# Patient Record
Sex: Male | Born: 1965 | Race: White | Hispanic: No | Marital: Married | Smoking: Former smoker
Health system: Southern US, Community
[De-identification: ages and names within clinical notes are randomized; demographics above are authoritative.]

## PROBLEM LIST (undated history)

## (undated) DIAGNOSIS — M159 Polyosteoarthritis, unspecified: Secondary | ICD-10-CM

## (undated) DIAGNOSIS — F419 Anxiety disorder, unspecified: Secondary | ICD-10-CM

## (undated) DIAGNOSIS — N5089 Other specified disorders of the male genital organs: Secondary | ICD-10-CM

## (undated) DIAGNOSIS — C6290 Malignant neoplasm of unspecified testis, unspecified whether descended or undescended: Secondary | ICD-10-CM

## (undated) HISTORY — DX: Other specified disorders of the male genital organs: N50.89

## (undated) HISTORY — PX: NO PAST SURGERIES: SHX2092

## (undated) HISTORY — DX: Anxiety disorder, unspecified: F41.9

## (undated) HISTORY — DX: Polyosteoarthritis, unspecified: M15.9

## (undated) HISTORY — DX: Malignant neoplasm of unspecified testis, unspecified whether descended or undescended: C62.90

---

## 2005-01-25 ENCOUNTER — Ambulatory Visit: Payer: Self-pay | Admitting: Internal Medicine

## 2005-02-05 ENCOUNTER — Ambulatory Visit: Payer: Self-pay | Admitting: Internal Medicine

## 2005-03-03 ENCOUNTER — Ambulatory Visit: Payer: Self-pay | Admitting: Internal Medicine

## 2005-03-10 ENCOUNTER — Encounter: Admission: RE | Admit: 2005-03-10 | Discharge: 2005-03-10 | Payer: Self-pay | Admitting: Internal Medicine

## 2005-03-12 ENCOUNTER — Encounter: Payer: Self-pay | Admitting: Internal Medicine

## 2005-06-24 ENCOUNTER — Ambulatory Visit: Payer: Self-pay | Admitting: Internal Medicine

## 2005-11-01 ENCOUNTER — Ambulatory Visit: Payer: Self-pay | Admitting: Internal Medicine

## 2006-01-04 ENCOUNTER — Ambulatory Visit: Payer: Self-pay | Admitting: Internal Medicine

## 2006-02-11 ENCOUNTER — Emergency Department: Payer: Self-pay | Admitting: Emergency Medicine

## 2006-04-21 ENCOUNTER — Ambulatory Visit: Payer: Self-pay | Admitting: Internal Medicine

## 2006-05-10 ENCOUNTER — Ambulatory Visit: Payer: Self-pay | Admitting: Internal Medicine

## 2006-07-22 ENCOUNTER — Ambulatory Visit: Payer: Self-pay | Admitting: Family Medicine

## 2006-07-27 ENCOUNTER — Ambulatory Visit: Payer: Self-pay | Admitting: Internal Medicine

## 2006-08-01 ENCOUNTER — Ambulatory Visit: Payer: Self-pay | Admitting: Internal Medicine

## 2006-11-25 ENCOUNTER — Ambulatory Visit: Payer: Self-pay | Admitting: Internal Medicine

## 2007-08-08 ENCOUNTER — Ambulatory Visit: Payer: Self-pay | Admitting: Internal Medicine

## 2007-08-08 DIAGNOSIS — J019 Acute sinusitis, unspecified: Secondary | ICD-10-CM

## 2009-01-30 ENCOUNTER — Ambulatory Visit: Payer: Self-pay | Admitting: Internal Medicine

## 2009-01-30 DIAGNOSIS — F4389 Other reactions to severe stress: Secondary | ICD-10-CM | POA: Insufficient documentation

## 2009-01-30 DIAGNOSIS — F438 Other reactions to severe stress: Secondary | ICD-10-CM

## 2009-02-21 ENCOUNTER — Telehealth (INDEPENDENT_AMBULATORY_CARE_PROVIDER_SITE_OTHER): Payer: Self-pay | Admitting: *Deleted

## 2009-02-21 ENCOUNTER — Ambulatory Visit: Payer: Self-pay | Admitting: Internal Medicine

## 2009-02-21 DIAGNOSIS — F411 Generalized anxiety disorder: Secondary | ICD-10-CM | POA: Insufficient documentation

## 2009-03-19 ENCOUNTER — Ambulatory Visit: Payer: Self-pay | Admitting: Internal Medicine

## 2009-03-19 DIAGNOSIS — J309 Allergic rhinitis, unspecified: Secondary | ICD-10-CM | POA: Insufficient documentation

## 2009-07-02 ENCOUNTER — Ambulatory Visit: Payer: Self-pay | Admitting: Family Medicine

## 2009-07-02 DIAGNOSIS — M159 Polyosteoarthritis, unspecified: Secondary | ICD-10-CM

## 2009-12-29 ENCOUNTER — Telehealth: Payer: Self-pay | Admitting: Internal Medicine

## 2011-01-19 NOTE — Progress Notes (Signed)
Summary: sinus infection   Phone Note Call from Patient Call back at Home Phone [phone]   Caller: Patient Call For: Cindee Salt MD Summary of Call: Patient called and said that he feels like he has sinus infection. Wants to know what he can take over the counter because he does not have the money to come in and be seen.  Initial call taken by: Melody Comas,  December 29, 2009 10:28 AM  Follow-up for Phone Call        okay to try tylenol or ibuprofen for general symptoms Honey helps cough Vick's vapor rub may help open up his sinuses Some people feel a decongestant helps them---okay to try this but only continue if sig improvement--since it may cause side effects Follow-up by: Cindee Salt MD,  December 29, 2009 11:29 AM  Additional Follow-up for Phone Call Additional follow up Details #1::        pt states he is having a little bit of blood coming out of his nose when he blows it, he thought he was getting a sinus infection.  I advised results and also told pt to add some moisture in his air at home, pt states he uses a fireplace at home. Please advise DeShannon Katrinka Blazing CMA [facility])  December 29, 2009 1:07 PM   Yes, the dry air is likely the reason for the nose bleeds. A humidifier can help, esp with the dry air from a fireplace Offer appt if he is not improving or worsens  Additional Follow-up by: Cindee Salt MD,  December 29, 2009 1:21 PM    Additional Follow-up for Phone Call Additional follow up Details #2::    Spoke with patient and advised results.  will call if appt needed Follow-up by: Mervin Hack CMA [facility]),  December 29, 2009 2:44 PM

## 2011-09-20 DIAGNOSIS — N5089 Other specified disorders of the male genital organs: Secondary | ICD-10-CM

## 2011-09-20 HISTORY — DX: Other specified disorders of the male genital organs: N50.89

## 2011-09-30 ENCOUNTER — Encounter: Payer: Self-pay | Admitting: Internal Medicine

## 2011-10-01 ENCOUNTER — Telehealth: Payer: Self-pay | Admitting: *Deleted

## 2011-10-01 ENCOUNTER — Ambulatory Visit (INDEPENDENT_AMBULATORY_CARE_PROVIDER_SITE_OTHER): Payer: Self-pay | Admitting: Family Medicine

## 2011-10-01 ENCOUNTER — Ambulatory Visit: Payer: Self-pay | Admitting: Family Medicine

## 2011-10-01 ENCOUNTER — Encounter: Payer: Self-pay | Admitting: Family Medicine

## 2011-10-01 VITALS — BP 130/80 | HR 88 | Temp 98.6°F | Wt 176.2 lb

## 2011-10-01 DIAGNOSIS — N50811 Right testicular pain: Secondary | ICD-10-CM

## 2011-10-01 DIAGNOSIS — N509 Disorder of male genital organs, unspecified: Secondary | ICD-10-CM

## 2011-10-01 DIAGNOSIS — N50819 Testicular pain, unspecified: Secondary | ICD-10-CM

## 2011-10-01 LAB — POCT URINALYSIS DIPSTICK
Bilirubin, UA: NEGATIVE
Blood, UA: NEGATIVE
Ketones, UA: NEGATIVE
Leukocytes, UA: NEGATIVE
Protein, UA: NEGATIVE
Spec Grav, UA: 1.025
Urobilinogen, UA: 0.2
pH, UA: 6

## 2011-10-01 MED ORDER — NAPROXEN 500 MG PO TABS: ORAL_TABLET | ORAL | Status: DC

## 2011-10-01 NOTE — Telephone Encounter (Signed)
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  Patient is waiting there to hear from [place].

## 2011-10-01 NOTE — Progress Notes (Signed)
  Subjective:    Patient ID: Jeff Wade, male    DOB: 1966/07/18, 45 y.o.   MRN: [id number]  HPI CC: swollen testicle  3-4 wk h/o swelling right testicle.  1 wk ago started getting even larger.  Yesterday much larger.  Started feeling pressure "squeeze" sensation.  Today feeling pressure left groin.  2d ago describes paresthesias left lateral thigh.  No fevers/chills, dysuria, urethral discharge, rashes.  Not sexually active recently, married.  For last several years if doesn't ejaculate after 3d feels pressure, tightness.  Not large amt straining.  Review of Systems Per HPI    Objective:   Physical Exam  Nursing note and vitals reviewed. Constitutional: He appears well-developed and well-nourished.  Abdominal: Soft. Bowel sounds are normal. He exhibits no distension. There is no tenderness. There is no rebound and no guarding. Hernia confirmed negative in the right inguinal area and confirmed negative in the left inguinal area.  Genitourinary: Penis normal. Right testis shows swelling and tenderness. Left testis shows no mass, no swelling and no tenderness. Left testis is descended. Cremasteric reflex is not absent on the left side. Circumcised.       Right testicle swollen and uniformly hardened. Especially tender posterior testicle at epididymis  Lymphadenopathy:       Right: No inguinal adenopathy present.       Left: No inguinal adenopathy present.  Skin: Skin is warm and dry. No rash noted.  Psychiatric: He has a normal mood and affect.          Assessment & Plan:

## 2011-10-01 NOTE — Patient Instructions (Signed)
Pass by Marion's office for referral to get ultrasound done today. Use anti inflammatory - naprosyn twice daily with food for 1 wk then as needed. Elevate scrotum when prolonged seated (towel under scrotum). We will call you at [phone]

## 2011-10-01 NOTE — Assessment & Plan Note (Addendum)
?  Infectious epididymo-orchitis - will need ultrasound today to r/o other (mass, torsion). Discussed NSAID use and elevation of scrotum in case infectious.  Possibly congestive epididymitis? [place] today - if looking like orchitis, treat with levo 500mg  daily for 10 days. UA today - normal.  Sent culture regardless. not high risk for STDs.

## 2011-10-01 NOTE — Telephone Encounter (Signed)
Called and discussed with pt around 11am. Have call in to urology, will call pt with f/u plan. Spoke with Dr. Laverle Patter - rec copy of ultrasound on CD and set up with urology for next week. Called pt and discussed. Will route to [facility] to get urology appt for early next week.

## 2011-10-03 LAB — URINE CULTURE: Colony Count: NO GROWTH

## 2011-10-04 NOTE — Telephone Encounter (Signed)
Urology appt made with Dr Mena Goes on 11/07/2011 records were faxed. MK

## 2011-10-15 ENCOUNTER — Encounter: Payer: Self-pay | Admitting: Family Medicine

## 2011-10-21 DIAGNOSIS — C6290 Malignant neoplasm of unspecified testis, unspecified whether descended or undescended: Secondary | ICD-10-CM

## 2011-10-21 HISTORY — DX: Malignant neoplasm of unspecified testis, unspecified whether descended or undescended: C62.90

## 2011-11-08 ENCOUNTER — Encounter (HOSPITAL_COMMUNITY): Payer: Self-pay | Admitting: Pharmacy Technician

## 2011-11-09 ENCOUNTER — Ambulatory Visit (HOSPITAL_COMMUNITY)
Admission: RE | Admit: 2011-11-09 | Discharge: 2011-11-09 | Disposition: A | Discharge status: Home / Self Care | Payer: Self-pay | Source: Ambulatory Visit | Attending: Urology | Admitting: Urology

## 2011-11-09 ENCOUNTER — Encounter (HOSPITAL_COMMUNITY)
Admission: RE | Admit: 2011-11-09 | Discharge: 2011-11-09 | Disposition: A | Discharge status: Home / Self Care | Payer: Self-pay | Source: Ambulatory Visit | Attending: Urology | Admitting: Urology

## 2011-11-09 ENCOUNTER — Encounter (HOSPITAL_COMMUNITY): Payer: Self-pay

## 2011-11-09 DIAGNOSIS — M159 Polyosteoarthritis, unspecified: Secondary | ICD-10-CM

## 2011-11-09 DIAGNOSIS — D4959 Neoplasm of unspecified behavior of other genitourinary organ: Secondary | ICD-10-CM | POA: Insufficient documentation

## 2011-11-09 DIAGNOSIS — Z01812 Encounter for preprocedural laboratory examination: Secondary | ICD-10-CM | POA: Insufficient documentation

## 2011-11-09 HISTORY — DX: Polyosteoarthritis, unspecified: M15.9

## 2011-11-09 LAB — SURGICAL PCR SCREEN: Staphylococcus aureus: NEGATIVE

## 2011-11-09 NOTE — Pre-Procedure Instructions (Signed)
11-09-11 Cxr Done. No labs required per anesthesia or EKG.

## 2011-11-09 NOTE — Patient Instructions (Addendum)
20 Jeff Wade  11/09/2011   Your procedure is scheduled on: 11-16-11  Report to [facility] at 10:30 AM.  Call this number if you have problems the morning of surgery: [phone]   Remember:   Do not eat food:After Midnight.  Do not drink clear liquids: 6 Hours before arrival.Clear Liquids until 06:00am only, then nothing.  Take these medicines the morning of surgery with A SIP OF WATER: Antihistamine med   Do not wear jewelry, make-up or nail polish.  Do not wear lotions, powders, or perfumes. You may wear deodorant.  Do not shave 48 hours prior to surgery.  Do not bring valuables to the hospital.  Contacts, dentures or bridgework may not be worn into surgery.  Leave suitcase in the car. After surgery it may be brought to your room.  For patients admitted to the hospital, checkout time is 11:00 AM the day of discharge.   Patients discharged the day of surgery will not be allowed to drive home.  Name and phone number of your driver:Jeff Wade, spouse [phone]  Special Instructions: CHG Shower Use Special Wash: 1/2 bottle night before surgery and 1/2 bottle morning of surgery.   Please read over the following fact sheets that you were given: MRSA Information

## 2011-11-15 NOTE — H&P (Addendum)
History of Present Illness        Patient first noticed Sep 2012 right testicle seemed larger than left. About a week later the right testicle seemed to continue to enlarge quickly and was hard. Felt pressure in right testicle. He has not noticed anything that has made the testicle better or worse. He has no significant pain. No trauma recently. No GU surgery or any other surgery. No kidney stones. He has had no dysuria, hematuria or other lower urinary tract symptoms. No fever or wt loss. He has chronic back pain. 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 UA Oct 01, 2011 clear and cx negative. Scrotal US Oct 01, 2011 - large hypoechoic area in right testicle worrisome for mass/malignancy. Left testicle normal (I reviewed all images).   Past Medical History Problems  1. History of  No Medical Problems  Surgical History Problems  1. History of  No Surgical Problems  Current Meds 1. Ibuprofen TABS; Therapy: (Recorded:18Oct2012) to 2. ZyrTEC Allergy 10 MG Oral Tablet; Therapy: (Recorded:18Oct2012) to  Allergies Medication  1. No Known Drug Allergies  Family History Problems  1. Family history of  Family Health Status Children ___ Living Sons 1 2. Family history of  Family Health Status Of Father - Alive 3. Family history of  Family Health Status Of Mother - Alive 4. Paternal history of  Nephrolithiasis  Social History Problems    Caffeine Use 3   Marital History - Currently Married   Occupation: Surveyor, minerals   Tobacco Use 305.1 smokes 1 ppd for 22 years Denied    History of  Alcohol Used  ROS (cont) - pt has had tiredness and fatigue for past year. Denies wt loss or night sweats/fevers.  Review of Systems Genitourinary,  skin, eye, otolaryngeal, hematologic/lymphatic, cardiovascular, pulmonary, endocrine, musculoskeletal, gastrointestinal, neurological and psychiatric system(s) were reviewed and pertinent findings if present are noted.  Musculoskeletal: back pain and  joint pain.     Physical Exam Constitutional: Well nourished and well developed . No acute distress.  ENT:. The ears and nose are normal in appearance.  Neck: The appearance of the neck is normal and no neck mass is present.  Pulmonary: No respiratory distress and normal respiratory rhythm and effort.  Cardiovascular: Heart rate and rhythm are normal . No peripheral edema.  Abdomen: The abdomen is soft and nontender. No masses are palpated. No CVA tenderness. No hernias are palpable. No hepatosplenomegaly noted.  Rectal: Rectal exam demonstrates normal sphincter tone, no tenderness and no masses. The prostate has no nodularity and is not tender. The left seminal vesicle is nonpalpable. The right seminal vesicle is nonpalpable. The perineum is normal on inspection.  Genitourinary: Examination of the penis demonstrates no discharge, no masses, no lesions and a normal meatus. The scrotum is without lesions. The right epididymis is palpably normal and non-tender. The left epididymis is palpably normal and non-tender. The right testis is found to have a 4 cm right testicular mass, but non-tender. The left testis is non-tender and without masses.  The testicles are easy to examine. The right testicle is enlarged compared to the left and mainly replaced by a large hard mass. Again, the left testicle and epididymis is normal on exam without mass.  Lymphatics: The femoral and inguinal nodes are not enlarged or tender.  Skin: Normal skin turgor, no visible rash and no visible skin lesions.  Neuro/Psych:. Mood and affect are appropriate.    Results/Data   The following images/tracing/specimen were independently visualized:  Scrotal US - 10/01/2011 - [facility] - large heterogenous mass in right testicle c/w physical exam.    Assessment Assessed  1. 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 Examined again pre-op holding. Right  testicle hard mass on exam today. Left testicle normal.         Discussed with patient findings on [place] report and physical exam worrisome for testicular cancer. Discussed a testicular mass may be benign (not cancer, e.g. infection, trauma, "blocked sperm", etc) or cancer (malignant). We discussed the nature, risks, and benefits of surveillance (do nothing), testicle biopsy or partial orchiectomy with frozen, and radical inguinal orchiectomy (hypogonadism/lowT, pain, infection, infertility among others). We discussed with the patient the nature, potential benefits, risks and alternatives to radical inguinal orchiectomy  (right)  including side effects of the proposed treatment, the likelihood of the patient achieving the goals of the procedure, and any potential problems that might occur during the procedure or recuperation. All questions answered. Patient elects to proceed with right radical inguinal orchiectomy. He is concerned about low T as he said he already has fatigue and tiredness. We discussed other symptoms/risks of low T such as low libido and trouble with erections. Tumor markers, CMP negative. Chest xray negative.  I also showed the patient in pre-op where a right inguinal incision would be located and discussed again the rationale for inguinal approach. We discussed post-op restrictions (no heavy lifting for 4-6 weeks) and he said he wasn't planning to do any such work.

## 2011-11-16 ENCOUNTER — Encounter (HOSPITAL_COMMUNITY): Payer: Self-pay | Admitting: Anesthesiology

## 2011-11-16 ENCOUNTER — Other Ambulatory Visit: Payer: Self-pay | Admitting: Urology

## 2011-11-16 ENCOUNTER — Encounter (HOSPITAL_COMMUNITY): Payer: Self-pay | Admitting: *Deleted

## 2011-11-16 ENCOUNTER — Ambulatory Visit (HOSPITAL_COMMUNITY)
Admission: RE | Admit: 2011-11-16 | Discharge: 2011-11-16 | Disposition: A | Discharge status: Home / Self Care | Payer: Self-pay | Source: Ambulatory Visit | Attending: Urology | Admitting: Urology

## 2011-11-16 ENCOUNTER — Encounter (HOSPITAL_COMMUNITY)
Admission: RE | Disposition: A | Discharge status: Home / Self Care | Payer: Self-pay | Source: Ambulatory Visit | Attending: Urology

## 2011-11-16 ENCOUNTER — Ambulatory Visit (HOSPITAL_COMMUNITY): Payer: Self-pay | Admitting: Anesthesiology

## 2011-11-16 DIAGNOSIS — C6290 Malignant neoplasm of unspecified testis, unspecified whether descended or undescended: Secondary | ICD-10-CM | POA: Insufficient documentation

## 2011-11-16 DIAGNOSIS — D176 Benign lipomatous neoplasm of spermatic cord: Secondary | ICD-10-CM | POA: Insufficient documentation

## 2011-11-16 HISTORY — PX: ORCHIECTOMY: SHX2116

## 2011-11-16 SURGERY — ORCHIECTOMY
Anesthesia: General | Site: Pelvis | Laterality: Right | Wound class: Clean

## 2011-11-16 MED ORDER — MIDAZOLAM HCL 5 MG/5ML IJ SOLN
INTRAMUSCULAR | Status: DC | PRN
  Administered 2011-11-16: 2 mg via INTRAVENOUS

## 2011-11-16 MED ORDER — LIDOCAINE HCL (CARDIAC) 20 MG/ML IV SOLN
INTRAVENOUS | Status: DC | PRN
  Administered 2011-11-16: 50 mg via INTRAVENOUS

## 2011-11-16 MED ORDER — ACETAMINOPHEN 10 MG/ML IV SOLN
INTRAVENOUS | Status: DC | PRN
  Administered 2011-11-16: 1000 mg via INTRAVENOUS

## 2011-11-16 MED ORDER — ACETAMINOPHEN 10 MG/ML IV SOLN
INTRAVENOUS | Status: AC
  Filled 2011-11-16: qty 100

## 2011-11-16 MED ORDER — PROMETHAZINE HCL 25 MG/ML IJ SOLN
INTRAMUSCULAR | Status: AC
  Filled 2011-11-16: qty 1

## 2011-11-16 MED ORDER — CEPHALEXIN 500 MG PO CAPS: 500.0000 mg | ORAL_CAPSULE | Freq: Three times a day (TID) | ORAL | Status: AC

## 2011-11-16 MED ORDER — FENTANYL CITRATE 0.05 MG/ML IJ SOLN
INTRAMUSCULAR | Status: AC
  Filled 2011-11-16: qty 2

## 2011-11-16 MED ORDER — BUPIVACAINE HCL (PF) 0.25 % IJ SOLN
INTRAMUSCULAR | Status: DC | PRN
  Administered 2011-11-16: 10 mL

## 2011-11-16 MED ORDER — LACTATED RINGERS IV SOLN
INTRAVENOUS | Status: DC
  Administered 2011-11-16: 15:00:00 via INTRAVENOUS
  Administered 2011-11-16: 1000 mL via INTRAVENOUS

## 2011-11-16 MED ORDER — PROPOFOL 10 MG/ML IV EMUL
INTRAVENOUS | Status: DC | PRN
  Administered 2011-11-16: 180 mg via INTRAVENOUS

## 2011-11-16 MED ORDER — FENTANYL CITRATE 0.05 MG/ML IJ SOLN
25.0000 ug | INTRAMUSCULAR | Status: DC | PRN
  Administered 2011-11-16: 25 ug via INTRAVENOUS

## 2011-11-16 MED ORDER — FENTANYL CITRATE 0.05 MG/ML IJ SOLN
INTRAMUSCULAR | Status: DC | PRN
  Administered 2011-11-16 (×5): 50 ug via INTRAVENOUS

## 2011-11-16 MED ORDER — OXYCODONE-ACETAMINOPHEN 5-325 MG PO TABS
ORAL_TABLET | ORAL | Status: AC
  Administered 2011-11-16: 1
  Filled 2011-11-16: qty 1

## 2011-11-16 MED ORDER — CEFAZOLIN SODIUM 1-5 GM-% IV SOLN
1.0000 g | Freq: Once | INTRAVENOUS | Status: AC
  Administered 2011-11-16: 1 g via INTRAVENOUS

## 2011-11-16 MED ORDER — OXYCODONE-ACETAMINOPHEN 5-325 MG PO TABS: 1.0000 | ORAL_TABLET | Freq: Four times a day (QID) | ORAL | Status: AC | PRN

## 2011-11-16 MED ORDER — PROMETHAZINE HCL 25 MG/ML IJ SOLN
6.2500 mg | INTRAMUSCULAR | Status: DC | PRN
  Administered 2011-11-16: 6.25 mg via INTRAVENOUS

## 2011-11-16 MED ORDER — CEFAZOLIN SODIUM 1-5 GM-% IV SOLN
INTRAVENOUS | Status: AC
  Filled 2011-11-16: qty 50

## 2011-11-16 MED ORDER — LACTATED RINGERS IV SOLN
INTRAVENOUS | Status: DC
  Administered 2011-11-16: 1000 mL via INTRAVENOUS

## 2011-11-16 SURGICAL SUPPLY — 35 items
ADH SKN CLS APL DERMABOND .7 (GAUZE/BANDAGES/DRESSINGS) ×2
BANDAGE GAUZE ELAST BULKY 4 IN (GAUZE/BANDAGES/DRESSINGS) ×2 IMPLANT
BLADE HEX COATED 2.75 (ELECTRODE) ×2 IMPLANT
CLOTH BEACON ORANGE TIMEOUT ST (SAFETY) ×2 IMPLANT
COVER SURGICAL LIGHT HANDLE (MISCELLANEOUS) ×2 IMPLANT
DERMABOND ADVANCED (GAUZE/BANDAGES/DRESSINGS) ×2
DERMABOND ADVANCED .7 DNX12 (GAUZE/BANDAGES/DRESSINGS) IMPLANT
DISSECTOR ROUND CHERRY 3/8 STR (MISCELLANEOUS) ×1 IMPLANT
DRAIN PENROSE 18X1/2 LTX STRL (DRAIN) ×1 IMPLANT
DRAIN PENROSE 18X1/4 LTX STRL (WOUND CARE) ×1 IMPLANT
DRAPE LAPAROSCOPIC ABDOMINAL (DRAPES) ×1 IMPLANT
ELECT REM PT RETURN 9FT ADLT (ELECTROSURGICAL) ×2
ELECTRODE REM PT RTRN 9FT ADLT (ELECTROSURGICAL) ×1 IMPLANT
GAUZE KERLIX 2  STERILE LF (GAUZE/BANDAGES/DRESSINGS) ×1 IMPLANT
GLOVE BIOGEL M STRL SZ7.5 (GLOVE) ×2 IMPLANT
GOWN STRL REIN XL XLG (GOWN DISPOSABLE) ×5 IMPLANT
KIT BASIN OR (CUSTOM PROCEDURE TRAY) ×2 IMPLANT
NEEDLE HYPO 22GX1.5 SAFETY (NEEDLE) IMPLANT
NS IRRIG 1000ML POUR BTL (IV SOLUTION) ×2 IMPLANT
PACK GENERAL/GYN (CUSTOM PROCEDURE TRAY) ×2 IMPLANT
SPONGE GAUZE 4X4 12PLY (GAUZE/BANDAGES/DRESSINGS) ×2 IMPLANT
SUPPORT SCROTAL LG STRP (MISCELLANEOUS) ×2 IMPLANT
SUPPORT SCROTAL LRG NO STRP (SOFTGOODS) ×1 IMPLANT
SUT CHROMIC 3 0 SH 27 (SUTURE) ×3 IMPLANT
SUT MNCRL AB 4-0 PS2 18 (SUTURE) ×1 IMPLANT
SUT SILK 0 SH 30 (SUTURE) ×2 IMPLANT
SUT SILK 3 0 SH 30 (SUTURE) IMPLANT
SUT VIC AB 2-0 SH 18 (SUTURE) ×1 IMPLANT
SUT VIC AB 2-0 UR5 27 (SUTURE) ×1 IMPLANT
SUT VICRYL 0 TIES 12 18 (SUTURE) IMPLANT
SWAB COLLECTION DEVICE MRSA (MISCELLANEOUS) ×1 IMPLANT
SYR CONTROL 10ML LL (SYRINGE) IMPLANT
TOWEL OR 17X26 10 PK STRL BLUE (TOWEL DISPOSABLE) ×4 IMPLANT
TUBE ANAEROBIC SPECIMEN COL (MISCELLANEOUS) ×1 IMPLANT
WATER STERILE IRR 1500ML POUR (IV SOLUTION) ×2 IMPLANT

## 2011-11-16 NOTE — Progress Notes (Signed)
Pt and pt's family given discharge instructions.  Both verbalized understanding.

## 2011-11-16 NOTE — Anesthesia Preprocedure Evaluation (Signed)
Anesthesia Evaluation  Patient identified by MRN, date of birth, ID band Patient awake    Reviewed: Allergy & Precautions, H&P , NPO status , Patient's Chart, lab work & pertinent test results  Airway Mallampati: II TM Distance: >3 FB Neck ROM: Full    Dental No notable dental hx.    Pulmonary neg pulmonary ROS, former smoker clear to auscultation  Pulmonary exam normal       Cardiovascular neg cardio ROS Regular Normal    Neuro/Psych Negative Neurological ROS  Negative Psych ROS   GI/Hepatic negative GI ROS, Neg liver ROS,   Endo/Other  Negative Endocrine ROS  Renal/GU negative Renal ROS  Genitourinary negative   Musculoskeletal negative musculoskeletal ROS (+)   Abdominal   Peds negative pediatric ROS (+)  Hematology negative hematology ROS (+)   Anesthesia Other Findings   Reproductive/Obstetrics negative OB ROS                           Anesthesia Physical Anesthesia Plan  ASA: II  Anesthesia Plan: General   Post-op Pain Management:    Induction: Intravenous  Airway Management Planned: LMA  Additional Equipment:   Intra-op Plan:   Post-operative Plan: Extubation in OR  Informed Consent: I have reviewed the patients History and Physical, chart, labs and discussed the procedure including the risks, benefits and alternatives for the proposed anesthesia with the patient or authorized representative who has indicated his/her understanding and acceptance.   Dental advisory given  Plan Discussed with: CRNA  Anesthesia Plan Comments:         Anesthesia Quick Evaluation

## 2011-11-16 NOTE — Progress Notes (Signed)
Pt up in room ambulating slowly without difficulty.  Pt voided large amount clear yellow urine.

## 2011-11-16 NOTE — Anesthesia Postprocedure Evaluation (Signed)
  Anesthesia Post-op Note  Patient: Jeff Wade  Procedure(s) Performed:  ORCHIECTOMY - Right Radical Inguinal Orchiectomy   Patient Location: PACU  Anesthesia Type: General  Level of Consciousness: awake and alert   Airway and Oxygen Therapy: Patient Spontanous Breathing  Post-op Pain: mild  Post-op Assessment: Post-op Vital signs reviewed, Patient's Cardiovascular Status Stable, Respiratory Function Stable, Patent Airway and No signs of Nausea or vomiting  Post-op Vital Signs: stable  Complications: No apparent anesthesia complications

## 2011-11-16 NOTE — Brief Op Note (Signed)
11/16/2011  2:37 PM  PATIENT:  Jeff Wade  45 y.o. 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 [239.5]  POST-OPERATIVE DIAGNOSIS:  right testicular mass (frozen: seminoma)  PROCEDURE:  Procedure(s): Right radical inguinal ORCHIECTOMY  SURGEON:  Surgeon(s): Antony Haste, MD  PHYSICIAN ASSISTANT: Pecola Leisure   ANESTHESIA:  General and  local  EBL:  < 10 mL  BLOOD ADMINISTERED:none  DRAINS: none   LOCAL MEDICATIONS USED:  MARCAINE 6 CC  SPECIMEN: 1) right testicle biopsy 2)  Excision - right testicle and cord  DISPOSITION OF SPECIMEN:  PATHOLOGY; frozen: seminoma  COUNTS:  YES  TOURNIQUET:  * No tourniquets in log *  DICTATION: .Other Dictation: Dictation Number [id number]  PLAN OF CARE: Discharge to home after PACU  PATIENT DISPOSITION:  PACU - hemodynamically stable.   Delay start of Pharmacological VTE agent (>24hrs) due to surgical blood loss or risk of bleeding: SCD's  in place

## 2011-11-16 NOTE — Transfer of Care (Signed)
Immediate Anesthesia Transfer of Care Note  Patient: Jeff Wade  Procedure(s) Performed:  ORCHIECTOMY - Right Radical Inguinal Orchiectomy   Patient Location: PACU  Anesthesia Type: General  Level of Consciousness: sedated, patient cooperative and responds to stimulaton  Airway & Oxygen Therapy: Patient Spontanous Breathing and Patient connected to face mask oxgen  Post-op Assessment: Report given to PACU RN and Post -op Vital signs reviewed and stable  Post vital signs: Reviewed and stable  Complications: No apparent anesthesia complications

## 2011-11-16 NOTE — Progress Notes (Signed)
Pt home with family 

## 2011-11-16 NOTE — Op Note (Signed)
Jeff Wade, Jeff Wade               ACCOUNT NO.:  [id number]  MEDICAL RECORD NO.:  [id number]  LOCATION:  [facility]                         FACILITY:  [facility]  PHYSICIAN:  Jerilee Field, MD   DATE OF BIRTH:  1966-03-11  DATE OF PROCEDURE: DATE OF DISCHARGE:                              OPERATIVE REPORT   PREOPERATIVE DIAGNOSIS:  Right testicular mass/neoplasm.  POSTOPERATIVE DIAGNOSIS:  Right testicular seminoma.  SURGEON:  Jerilee Field, MD.  ASSISTANT:  Pecola Leisure, PA  ANESTHESIA:  General with local skin infiltration.  ESTIMATED BLOOD LOSS:  Less than 10 mL.  INDICATION FOR PROCEDURE:  Jeff Wade is a 45 year old male who noticed right testicular enlargement in September 2012.  The right testicle was hard and continued to enlarge where an ultrasound revealed infiltrating hyperechoic mass.  I discussed the nature, risks, benefits, and alternatives to right radical inguinal orchiectomy.  All questions were answered and the patient elected to proceed with right radical inguinal orchiectomy.  FINDINGS ON EXAM:  Right testicle was very hard, infiltrated mostly with several subtle masses.  After tourniquet was placed, it was delivered into the wound.  The tunica vaginalis was opened.  The testicle was opened and biopsied because there was some fluid around the epididymis that appeared almost like purulent fluid, and I was concerned he may just have an infectious process or abscess.  With the tourniquet in place, the tunica albuginea was opened and the testicle biopsied.  The frozen section came back consistent with neoplastic cells of seminoma.  DESCRIPTION OF PROCEDURE:  After consent was obtained, the patient brought to the operating room.  A time-out was performed to confirm the patient and procedure.  After adequate anesthesia, he underwent an exam under anesthesia, which again revealed a large right hard testicular mass.  The left testicle was palpably normal.  It  was also normal on his preop ultrasound.  The right inguinal area was shaved with clippers. The lower abdomen and genitalia were prepped and draped in the usual sterile fashion.  The skin was infiltrated in the right inguinal region above the external ring with 6 cc of Marcaine and epinephrine.  An incision was then made, carried down to the fascia where the external oblique aponeurosis was visualized in the cord exiting from the external ring.  The ilioinguinal nerve was identified. It appeared to branch send a lateral branch over the cord after the cord exited the external ring. It straddled the cord.  Distal to the nerve, the cord was surrounded with a Penrose twice and a tourniquet was applied.  Testicle was delivered into the wound, the tunica vaginalis opened, testicle inspected, the tunica albuginea opened, and a biopsy obtained which was sent for frozen.  Frozen again was consistent with seminoma.  Testicle was then brought up into the wound.  The gubernacular fibers were detached with the Bovie cautery.  I made every effort to try to get the testicle under the structure, possible branch of the ilioinguinal nerve that branched and straddled the cord, but the testicle was simply too big to pass under the nerve.  Therefore, the lateral branch was tied and cut. This released the nerve medially.  The nerve  was swept off the cord. The external oblique aponeurosis was opened with a 15 blade up to the internal ring, where the cord was dissected.  The cremasteric muscle was separated from the cord with the Bovie cautery and with a Kittner, the cord was cleared all the way back to the internal ring.  Hemostats were then placed on the vas deferens and the rest of the cord separately. There was a small cord lipoma, which was separated from the cord and allowed to lay back down in the inguinal floor.  The vas deferens was then transected and suture ligated with a 2-0 Vicryl suture.  The  suture was then cut and was allowed to retract back into the retroperitoneum after adequate hemostasis was ensured.  The cord was then suture ligated with a 0 silk suture x2.  One of these sutures was several centimeters long.  The cord was then transected with the [facility] and retracted back into the retroperitoneum after adequate hemostasis was ensured.  The testicle and cord were then sent to Pathology.  The wound was copiously irrigated with water.  Adequate hemostasis was ensured.  The edges of the external oblique aponeurosis were then reapproximated with a running 3-0 PDS suture.  The ilioinguinal nerve was retracted inferiorly and not brought into the suture line.  This recreated the external ring.  The wound was copiously irrigated again and the fascia was closed with interrupted 2-0 Vicryl suture.  The skin was then closed with a running subcuticular Monocryl and Dermabond.  The patient was then awakened, taken to  recovery room in stable condition.  DRAINS:  None.  COMPLICATIONS:  None.  SPECIMEN: 1. Right testicle biopsy. 2. Right testicle and cord. Specimens sent to Pathology.  Counts were correct.  DISPOSITION:  Patient stable to PACU.          ______________________________ Jerilee Field, MD     ME/MEDQ  D:  11/16/2011  T:  11/16/2011  Job:  [id number]

## 2011-11-18 ENCOUNTER — Encounter (HOSPITAL_COMMUNITY): Payer: Self-pay | Admitting: Urology

## 2011-11-18 LAB — WOUND CULTURE: Culture: NO GROWTH

## 2011-11-19 ENCOUNTER — Other Ambulatory Visit (HOSPITAL_COMMUNITY): Payer: Self-pay | Admitting: Urology

## 2011-11-19 DIAGNOSIS — C6290 Malignant neoplasm of unspecified testis, unspecified whether descended or undescended: Secondary | ICD-10-CM

## 2011-11-25 ENCOUNTER — Ambulatory Visit (HOSPITAL_COMMUNITY)
Admission: RE | Admit: 2011-11-25 | Discharge: 2011-11-25 | Disposition: A | Discharge status: Home / Self Care | Payer: Self-pay | Source: Ambulatory Visit | Attending: Urology | Admitting: Urology

## 2011-11-25 ENCOUNTER — Encounter (HOSPITAL_COMMUNITY): Payer: Self-pay

## 2011-11-25 DIAGNOSIS — C6290 Malignant neoplasm of unspecified testis, unspecified whether descended or undescended: Secondary | ICD-10-CM | POA: Insufficient documentation

## 2011-11-25 DIAGNOSIS — K429 Umbilical hernia without obstruction or gangrene: Secondary | ICD-10-CM | POA: Insufficient documentation

## 2011-11-25 MED ORDER — IOHEXOL 300 MG/ML  SOLN
100.0000 mL | Freq: Once | INTRAMUSCULAR | Status: AC | PRN
  Administered 2011-11-25: 100 mL via INTRAVENOUS

## 2011-12-08 ENCOUNTER — Encounter: Payer: Self-pay | Admitting: Internal Medicine

## 2011-12-10 ENCOUNTER — Telehealth: Payer: Self-pay | Admitting: Oncology

## 2011-12-10 NOTE — Telephone Encounter (Signed)
S/w pt today re appt for 1/8 @ 1:30 pm w/FS.

## 2011-12-16 ENCOUNTER — Telehealth: Payer: Self-pay | Admitting: Oncology

## 2011-12-16 NOTE — Telephone Encounter (Signed)
Referred by Dr. Mena Goes Dx- Testicular Ca

## 2011-12-20 ENCOUNTER — Encounter: Payer: Self-pay | Admitting: *Deleted

## 2011-12-24 ENCOUNTER — Other Ambulatory Visit: Payer: Self-pay | Admitting: Oncology

## 2011-12-24 DIAGNOSIS — C6290 Malignant neoplasm of unspecified testis, unspecified whether descended or undescended: Secondary | ICD-10-CM

## 2011-12-28 ENCOUNTER — Ambulatory Visit: Payer: Self-pay

## 2011-12-28 ENCOUNTER — Ambulatory Visit (HOSPITAL_BASED_OUTPATIENT_CLINIC_OR_DEPARTMENT_OTHER): Payer: Self-pay | Admitting: Oncology

## 2011-12-28 ENCOUNTER — Other Ambulatory Visit: Payer: Self-pay | Admitting: Lab

## 2011-12-28 VITALS — BP 133/83 | HR 97 | Temp 97.7°F | Ht 65.5 in | Wt 180.7 lb

## 2011-12-28 DIAGNOSIS — C6290 Malignant neoplasm of unspecified testis, unspecified whether descended or undescended: Secondary | ICD-10-CM

## 2011-12-28 LAB — CBC WITH DIFFERENTIAL/PLATELET
BASO%: 0.3 % (ref 0.0–2.0)
EOS%: 2.8 % (ref 0.0–7.0)
MCH: 30.4 pg (ref 27.2–33.4)
MCHC: 34.1 g/dL (ref 32.0–36.0)
RBC: 5.11 10*6/uL (ref 4.20–5.82)
RDW: 12.4 % (ref 11.0–14.6)
lymph#: 1.9 10*3/uL (ref 0.9–3.3)

## 2011-12-28 NOTE — Progress Notes (Signed)
CC:   Karie Schwalbe, MD Jerilee Field, MD  REFERRING PHYSICIAN:  Jerilee Field, MD  REASON FOR CONSULTATION:  Testicular cancer.  HISTORY OF PRESENT ILLNESS:  This is a pleasant 46 year old gentleman, a native of [place], who lived the majority of his life around this area.  He has been in [employer] for the majority of his life, and he currently works as a Product/process development scientist.  He is a gentleman with really no past medical history.  He does have history of allergies and anxiety. He really was in his normal state of health until about 2 or 3 months ago, when he started noting a right testicular mass.  It was nontender. It was enlarging over a period of 1 to 2 months.  After period of observation he finally elected to have it evaluated by Dr. Mena Goes.  He initially had a testicular ultrasound, which suggested a malignant neoplasm.  On 11/16/2011, after lots of delays due to Mr. Marcussen's unwillingness to proceed with the operation due to a lot of financial reasons, he underwent a right radical inguinal orchiectomy.  The pathology was case number [id number].  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  Margins were not involved.  Two nodules of tumor, the largest of which measured 4.2 cm, was classic seminoma. It was confined to the testes.  Free of tumor was margins at that time. Lymphovascular invasion was present focally, and the pathological staging was pT2 NX.  The patient had a CT scan of the abdomen and pelvis, done on 11/25/2011, and basically showed no evidence of any intra-abdominal metastasis, indicating stage IB disease.  The patient was referred to me for evaluation regarding further management and workup.  Clinically, Mr. Winther feels very well.  He has recovered well from his operation.  He is not reporting any abdominal pain, is not reporting any discomfort.  He is able to perform most activities of daily living without any hindrance or  decline.  REVIEW OF SYSTEMS:  He did not report any headaches, blurred vision or double vision.  Did not report any motor or sensory neuropathy.  Did not report any alteration in mental status.  Did not report any psychiatric issues or depression.  Did not report any fever, chills or sweats.  Did not report any cough, hemoptysis or hematemesis.  No nausea or vomiting. No abdominal pain.  No hematochezia, melena or genitourinary complaints. The rest of the review of systems is unremarkable.  PAST MEDICAL HISTORY:  Significant for anxiety, history of seasonal allergy.  No history of hypertension, diabetes.  Status post orchiectomy as mentioned.  MEDICATIONS:  Ibuprofen and Zyrtec.  ALLERGIES:  None.  FAMILY HISTORY:  His mother has what appears to be a GYN malignancy. She is currently alive.  She sees Dr. Stanford Breed.  His father has Parkinson disease.  SOCIAL HISTORY:  He is married but separated.  He has 1 son.  He smokes regularly but quit about November 2012.  Denied any alcohol abuse.  PHYSICAL EXAMINATION:  An alert, awake gentleman who appeared in no active distress.  His blood pressure is 133/83, pulse is 97, respirations 20, temperature is 97, weighs 180 pounds.  Head is normocephalic and atraumatic.  Pupils are equal, round and reactive to light.  Oral mucosa is moist and pink.  Neck is supple without adenopathy.  Heart is regular rate and rhythm.  S1 and S2.  Lungs are clear to auscultation without rhonchi, wheeze or dullness to percussion. Abdomen is  soft, nontender.  No hepatosplenomegaly.  Extremities:  No clubbing, cyanosis, or edema.  Neurological:  Intact motor, sensory, and deep tendon reflexes.  LABORATORY DATA:  Hemoglobin of 15.5, white cell count 7.5, platelet count 185.  His tumor markers, including LDH, were normal.  Beta HCG is 0.8.  Alpha fetoprotein 2.6.  ASSESSMENT AND PLAN:  This is a pleasant 46 year old gentleman with the following issue:   Stage IB seminoma.  He had a right testicular mass measuring 4.2 cm.  He is status post orchiectomy on 11/16/2011.  His tumor showed, again, 4.2 cm with very focal lymphovascular invasion.  I had a lengthy discussion today with Mr. Lotito, discussing the natural course of testicular cancer in general, and more specifically stage IB seminoma.  I talked to him about the treatment approach at this time.  Clearly, with a radical orchiectomy at this point, he has about an 80% to 85% chance of a cure without really any treatment. Unfortunately, I explained to Mr. Krzyzanowski that given the fact that he had a focal lymphovascular invasion, maybe that increases his risk slightly of recurrence.  However, I still believe the odds are still in his favor of really hopefully never seeing this cancer again.  I talked to him in detail the role of adjuvant therapy to try to minimize the risk of recurrence.  I talked to him about the role of adjuvant radiation therapy, as well as adjuvant chemotherapy, the risks and benefits of both, as well as the potential toxicities, especially delayed complications of both modalities, including secondary malignancy.  I have also outlined to him the risks and benefits of active surveillance at this time.  Also, we discussed the financial burden, which is very important to Mr. Winograd, given the fact that he is really self-pay at this point.  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 Granquist as best as I could today regarding these issues.  Overall, I feel that it is reasonable to proceed with active surveillance if those are his wishes.  Probably we would not repeat any imaging studies for another 6 months.  We will probably eliminate tumor markers in the future given the fact that this was a pure seminoma and his tumor markers have been negative up front, and will probably not  correlate as well at this time.  All his questions were answered today.    ______________________________ Benjiman Core, M.D. FNS/MEDQ  D:  12/28/2011  T:  12/28/2011  Job:  [id number]

## 2011-12-28 NOTE — Progress Notes (Signed)
Note dictated

## 2011-12-29 ENCOUNTER — Ambulatory Visit: Payer: Self-pay

## 2011-12-29 ENCOUNTER — Ambulatory Visit: Payer: Self-pay | Admitting: Oncology

## 2011-12-29 ENCOUNTER — Other Ambulatory Visit: Payer: Self-pay | Admitting: Lab

## 2011-12-31 LAB — COMPREHENSIVE METABOLIC PANEL
ALT: 16 U/L (ref 0–53)
AST: 20 U/L (ref 0–37)
Albumin: 4.6 g/dL (ref 3.5–5.2)
Calcium: 9.5 mg/dL (ref 8.4–10.5)
Chloride: 99 mEq/L (ref 96–112)
Creatinine, Ser: 1.27 mg/dL (ref 0.50–1.35)
Potassium: 4 mEq/L (ref 3.5–5.3)

## 2011-12-31 LAB — LACTATE DEHYDROGENASE: LDH: 172 U/L (ref 94–250)

## 2011-12-31 LAB — AFP TUMOR MARKER: AFP-Tumor Marker: 2.7 ng/mL (ref 0.0–8.0)

## 2012-03-24 ENCOUNTER — Ambulatory Visit (INDEPENDENT_AMBULATORY_CARE_PROVIDER_SITE_OTHER): Payer: BC Managed Care – PPO | Admitting: Family Medicine

## 2012-03-24 ENCOUNTER — Ambulatory Visit: Payer: Self-pay | Admitting: Internal Medicine

## 2012-03-24 ENCOUNTER — Encounter: Payer: Self-pay | Admitting: Family Medicine

## 2012-03-24 VITALS — BP 124/80 | HR 84 | Temp 98.4°F | Wt 184.0 lb

## 2012-03-24 DIAGNOSIS — J029 Acute pharyngitis, unspecified: Secondary | ICD-10-CM

## 2012-03-24 LAB — POCT RAPID STREP A (OFFICE): Rapid Strep A Screen: NEGATIVE

## 2012-03-24 MED ORDER — LIDOCAINE VISCOUS 2 % MT SOLN: 10.0000 mL | OROMUCOSAL | Status: DC | PRN

## 2012-03-24 MED ORDER — BENZONATATE 200 MG PO CAPS: 200.0000 mg | ORAL_CAPSULE | Freq: Three times a day (TID) | ORAL | Status: DC | PRN

## 2012-03-24 NOTE — Patient Instructions (Signed)
We'll contact you with your lab report. Drink plenty of fluids and gargle with warm salt water for your throat.  [place] the lidocaine if needed for your throat and take tessalon for the cough if needed.  This should gradually improve.  Take care.  Let us know if you have other concerns.

## 2012-03-24 NOTE — Progress Notes (Signed)
H/o R orchiectomy for CA, followed by uro.  Not on chemo.   ST.  Going on for 5 days.  Worse over last 2-3 days.  Using cough drops.  No ear pain now, felt a little full prev.  No fevers.  Some cough, better with cough drops.  Cough originates in the throat, not the chest.  No sputum.  No diarrhea, no aches.  No sick contacts.  Son had strep a few weeks ago, has cold sx in meantime. ST is most bothersome,  RST neg.    Meds, vitals, and allergies reviewed.   ROS: See HPI.  Otherwise, noncontributory.  GEN: nad, alert and oriented HEENT: mucous membranes moist, tm w/o erythema, nasal exam w/o erythema, clear discharge noted,  OP with cobblestoning, sinuses not ttp NECK: supple w/o LA CV: rrr.   PULM: ctab, no inc wob EXT: no edema SKIN: no acute rash

## 2012-03-26 DIAGNOSIS — J029 Acute pharyngitis, unspecified: Secondary | ICD-10-CM | POA: Insufficient documentation

## 2012-03-26 NOTE — Assessment & Plan Note (Signed)
RST and culture neg.  Supportive care, no indication for abx.  Nontoxic. F/u prn.

## 2012-03-28 ENCOUNTER — Ambulatory Visit (INDEPENDENT_AMBULATORY_CARE_PROVIDER_SITE_OTHER): Payer: BC Managed Care – PPO | Admitting: Internal Medicine

## 2012-03-28 ENCOUNTER — Encounter: Payer: Self-pay | Admitting: Internal Medicine

## 2012-03-28 VITALS — BP 118/70 | HR 81 | Temp 98.4°F | Wt 184.0 lb

## 2012-03-28 DIAGNOSIS — J019 Acute sinusitis, unspecified: Secondary | ICD-10-CM

## 2012-03-28 MED ORDER — AMOXICILLIN 500 MG PO TABS: 1000.0000 mg | ORAL_TABLET | Freq: Two times a day (BID) | ORAL | Status: AC

## 2012-03-28 NOTE — Patient Instructions (Signed)
Please call to set up a physical at your convenience

## 2012-03-28 NOTE — Assessment & Plan Note (Signed)
Ongoing upper resp symptoms Now with some purulent nasal secretions Will try empiric Rx with amoxil

## 2012-03-28 NOTE — Progress Notes (Signed)
  Subjective:    Patient ID: Jeff Wade, male    DOB: October 06, 1966, 46 y.o.   MRN: [id number]  HPI Still has sore throat Didn't have strep  "it feels like someone was cutting me with a razor blade" Throat seemed somewhat better as of yesterday Still hurts with swallowing Pressure on ears now  Having nighttime cough Has to sit up due to SOB--breathe through his nose No sputum but feels like there is something down there Green nasal discharge---esp in AM  No fever  Son diagnosed with sinus infection  Using some dayquil--worked better than benzonatate Lidocaine no help  Current Outpatient Prescriptions on File Prior to Visit  Medication Sig Dispense Refill  . fexofenadine (ALLEGRA) 180 MG tablet Take 180 mg by mouth daily as needed. allergies      . ibuprofen (ADVIL,MOTRIN) 200 MG tablet Take 400 mg by mouth every 8 (eight) hours as needed. Pain         No Known Allergies  Past Medical History  Diagnosis Date  . Anxiety   . Allergic rhinitis   . Mass of testicle 09/2011    R, concern for cancer, pending surgery  . Generalized osteoarthrosis, involving multiple sites 11-09-11    joint pain more in knees  . Seminoma of testis 11/12    Stage 1B    Past Surgical History  Procedure Date  . No past surgeries   . Orchiectomy 11/16/2011    Procedure: ORCHIECTOMY;  Surgeon: Antony Haste, MD;  Location: [facility];  Service: Urology;  Laterality: Right;  Right Radical Inguinal Orchiectomy     Family History  Problem Relation Age of Onset  . Diabetes Mother   . Ovarian cancer Mother   . Cancer Mother     unknown type  . Healthy Father   . Coronary artery disease Neg Hx   . Hypertension Neg Hx     History   Social History  . Marital Status: Married    Spouse Name: N/A    Number of Children: 3  . Years of Education: N/A   Occupational History  . Contractor    Social History Main Topics  . Smoking status: Former Smoker -- 1.0 packs/day for 30 years     Types: Cigarettes    Quit date: 11/01/2011  . Smokeless tobacco: Never Used  . Alcohol Use: 0.6 oz/week    1 Cans of beer per week     Very rare  . Drug Use: No  . Sexually Active: Yes   Other Topics Concern  . Not on file   Social History Narrative   Married; 3 children and 2 step-childrenContractor--out of work lately   Review of Systems No rash No vomiting or diarrhea Appetite is off    Objective:   Physical Exam  Constitutional: He appears well-developed and well-nourished. No distress.  HENT:  Mouth/Throat: Oropharynx is clear and moist. No oropharyngeal exudate.       No sinus tenderness Moderate nasal swelling and inflammation TMS okay  Neck: Normal range of motion. Neck supple. No thyromegaly present.       Mildly tender right ant cervical node  Pulmonary/Chest: No respiratory distress. He has no wheezes. He has no rales.       Decreased breath sounds at bases---otherwise clear  Lymphadenopathy:    He has cervical adenopathy.          Assessment & Plan:

## 2012-06-28 ENCOUNTER — Other Ambulatory Visit: Payer: Self-pay

## 2012-06-28 ENCOUNTER — Ambulatory Visit: Payer: Self-pay | Admitting: Oncology

## 2013-07-22 IMAGING — US US PELVIS LIMITED
1 series · 17 of 25 positions shown · non-contrast
Comparison: none

REASON FOR EXAM: STAT CR [id number] right testicular pain
COMMENTS:

PROCEDURE:     US  - US TESTICULAR  - October 01, 2011  [DATE]
RESULT:     Comparison: None
TECHNIQUE: Multiple gray-scale, color-flow Doppler, and spectral waveform
tracings of the testicles and testicular vasculature are presented for
review.

[Series 1: us pelvis limited · 17 of 93 slices shown]
[im 1/93]
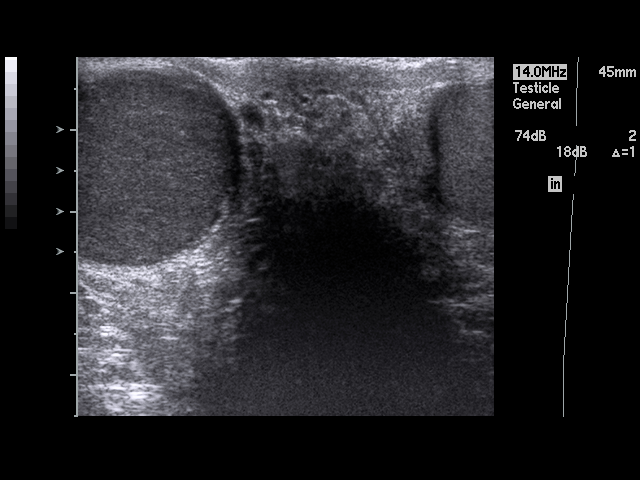
[im 8/93]
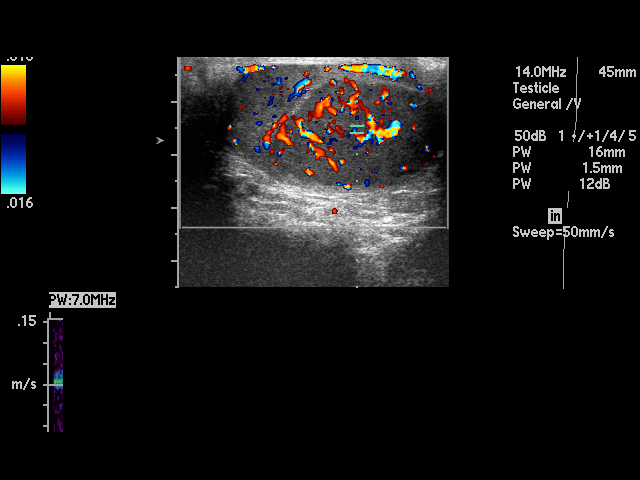
[im 12/93]
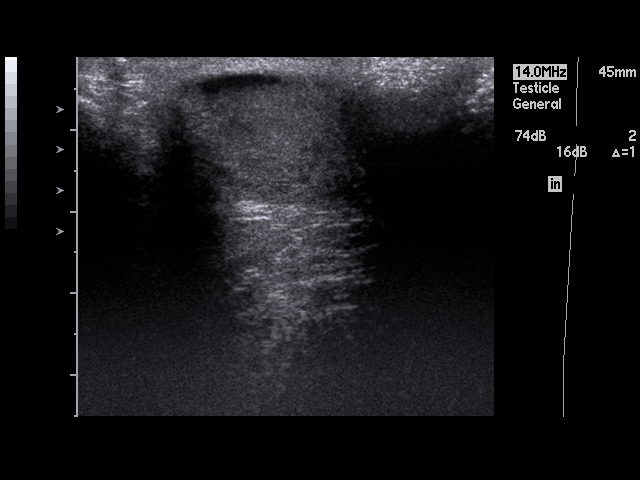
[im 20/93]
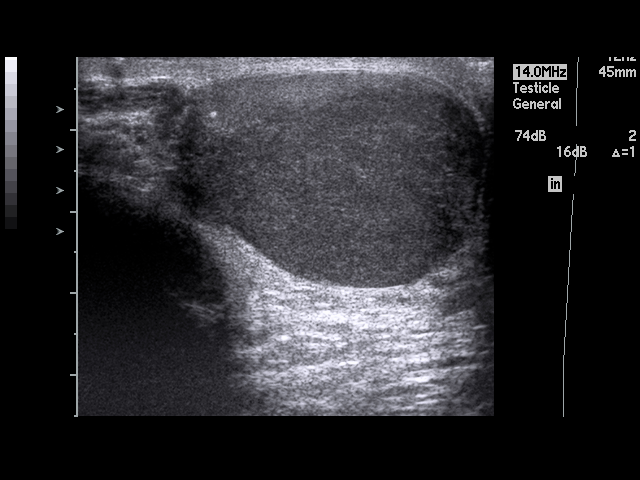
[im 24/93]
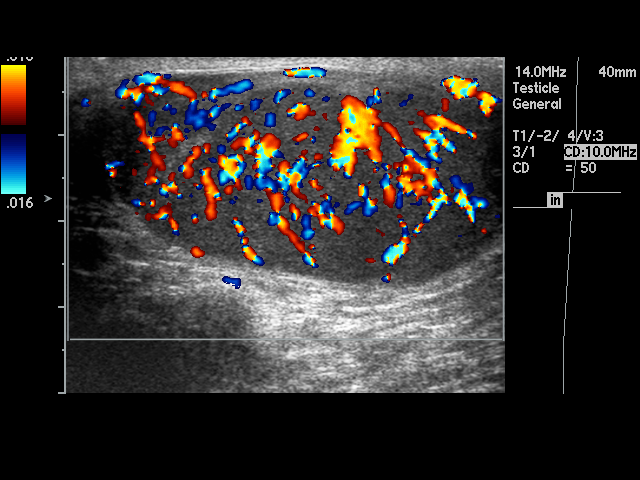
[im 31/93]
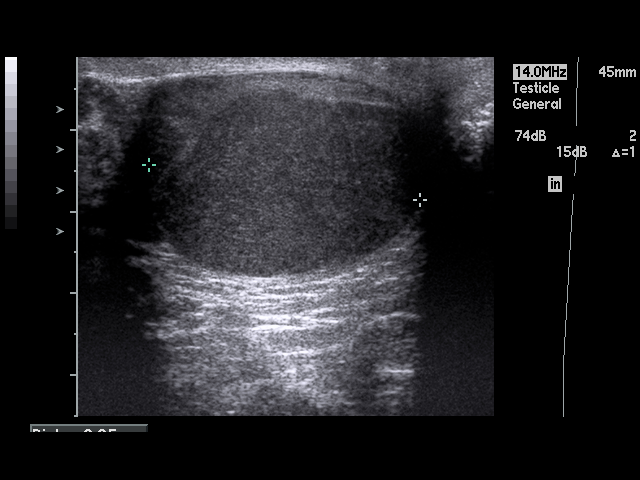
[im 35/93]
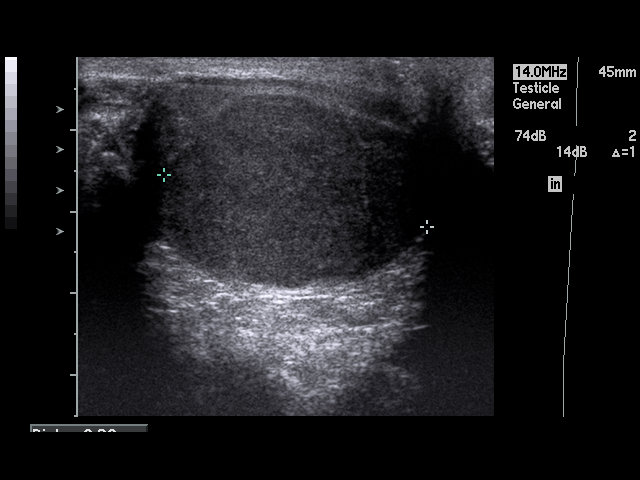
[im 43/93]
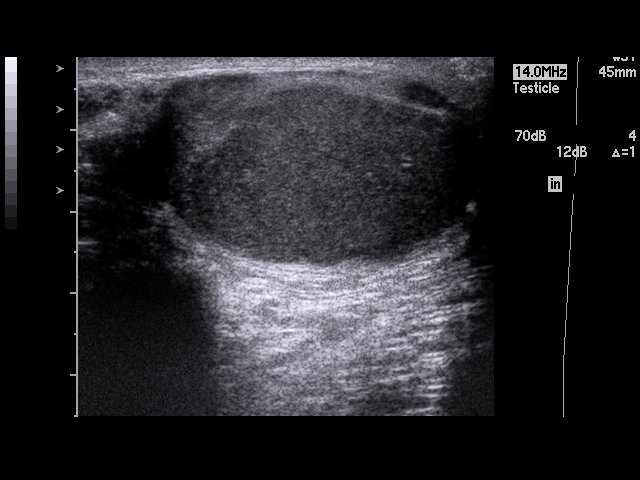
[im 47/93]
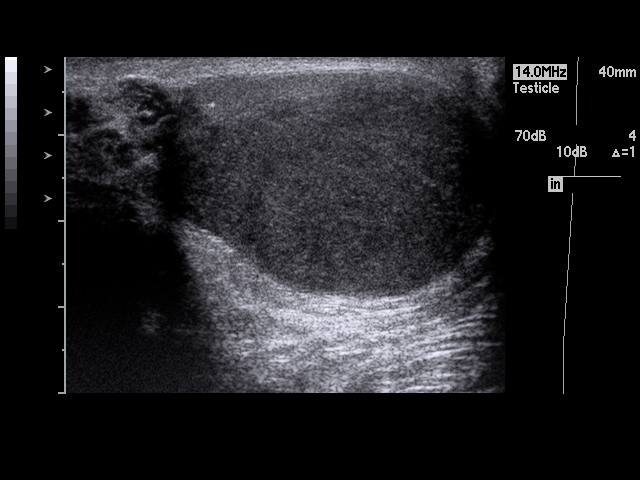
[im 50/93]
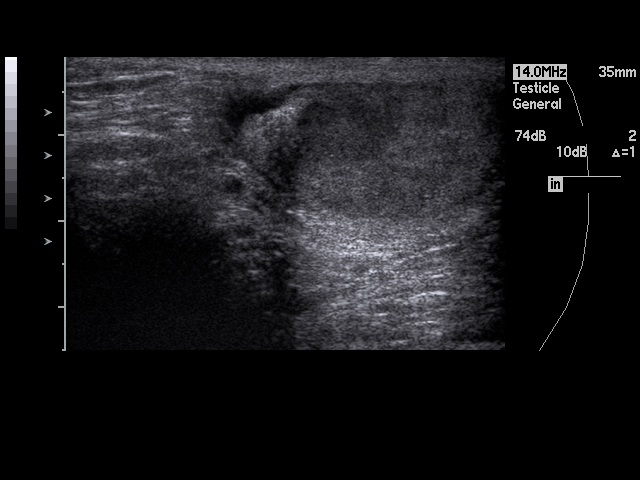
[im 58/93]
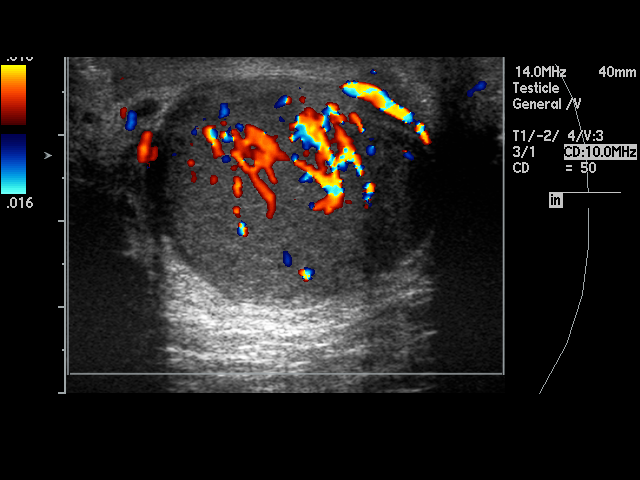
[im 62/93]
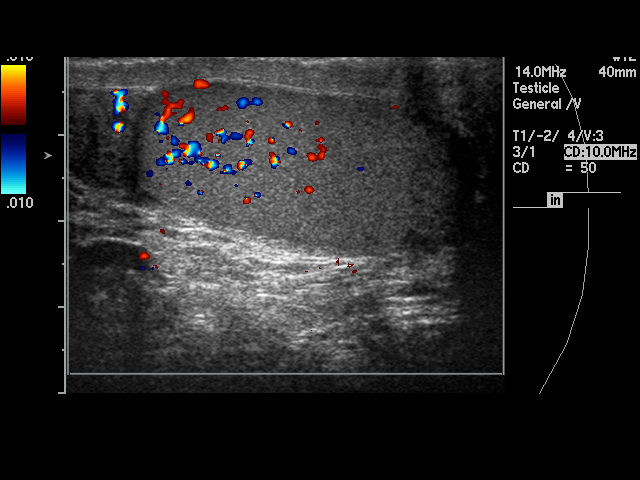
[im 70/93]
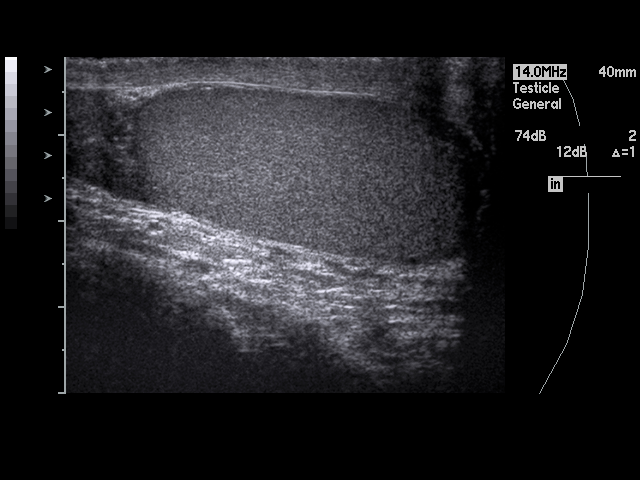
[im 73/93]
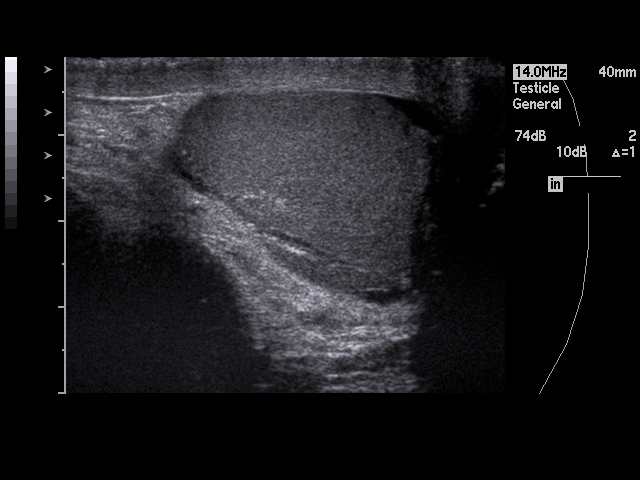
[im 81/93]
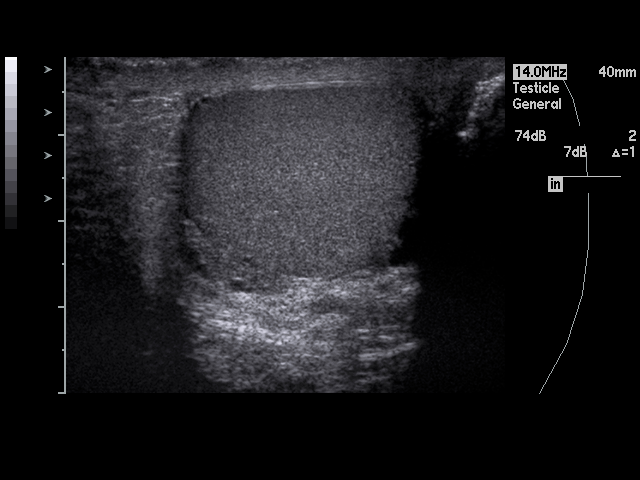
[im 85/93]
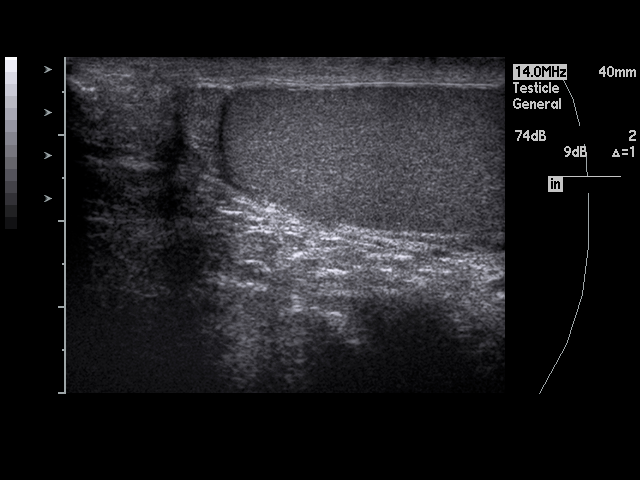
[im 93/93]
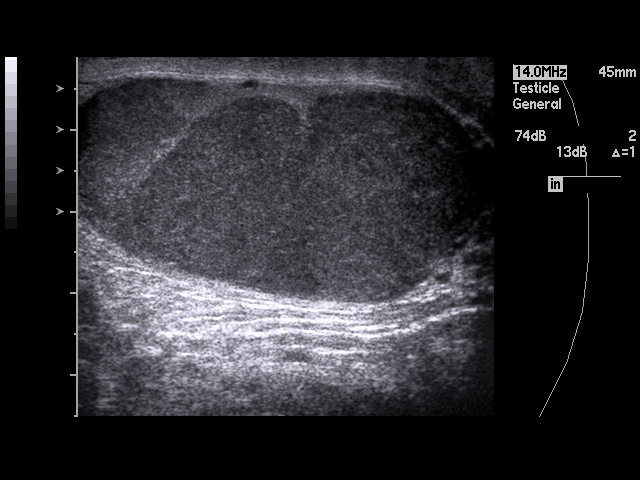

[17 of 25 positions shown; findings below may reference images not displayed]

FINDINGS: The right testicle measures 4.9 x 2.7 x 3.3 cm. The left testicle measures
3.9 x 1.8 x 2.8 cm.  There is a large round area of hypoechogenicity in the
right testicle which is concerning for an intratesticular mass. There is
color Doppler flow within this mass. It measures 4.0 x 2.4 x 2.4 cm. There
is a smaller, subcentimeter area of hypoechogenicity in the right testicle
which may represent a separate small mass. It measures 6 mm in greatest
dimension. The epididymis is unremarkable.

The left testicle is homogeneous in echotexture. Arterial and venous
spectral Doppler waveforms are demonstrated in the bilateral testicles.
IMPRESSION: Large mass, and possibly smaller adjacent mass, in the right testicle.
Differential is primarily concerning for malignancy. Correlate for a history
of trauma, as a hematoma could have a similar appearance. Urology
consultation is suggested.

This was called to Phan Kasper at 5938 hours 10/01/2011.

## 2015-09-07 ENCOUNTER — Emergency Department: Payer: Self-pay

## 2015-09-07 ENCOUNTER — Emergency Department
Admission: EM | Admit: 2015-09-07 | Discharge: 2015-09-07 | Disposition: A | Discharge status: Home / Self Care | Payer: Self-pay | Attending: Emergency Medicine | Admitting: Emergency Medicine

## 2015-09-07 DIAGNOSIS — N200 Calculus of kidney: Secondary | ICD-10-CM

## 2015-09-07 DIAGNOSIS — Z79899 Other long term (current) drug therapy: Secondary | ICD-10-CM | POA: Insufficient documentation

## 2015-09-07 DIAGNOSIS — K5900 Constipation, unspecified: Secondary | ICD-10-CM | POA: Insufficient documentation

## 2015-09-07 DIAGNOSIS — R109 Unspecified abdominal pain: Secondary | ICD-10-CM

## 2015-09-07 DIAGNOSIS — Z87891 Personal history of nicotine dependence: Secondary | ICD-10-CM | POA: Insufficient documentation

## 2015-09-07 DIAGNOSIS — N23 Unspecified renal colic: Secondary | ICD-10-CM | POA: Insufficient documentation

## 2015-09-07 LAB — BASIC METABOLIC PANEL
Anion gap: 11 (ref 5–15)
BUN: 17 mg/dL (ref 6–20)
CALCIUM: 9.1 mg/dL (ref 8.9–10.3)
CO2: 28 mmol/L (ref 22–32)
CREATININE: 1.63 mg/dL — AB (ref 0.61–1.24)
Chloride: 98 mmol/L — ABNORMAL LOW (ref 101–111)
GFR calc Af Amer: 55 mL/min — ABNORMAL LOW (ref >60)
GFR calc non Af Amer: 48 mL/min — ABNORMAL LOW (ref >60)
GLUCOSE: 107 mg/dL — AB (ref 65–99)
Potassium: 3.4 mmol/L — ABNORMAL LOW (ref 3.5–5.1)
Sodium: 137 mmol/L (ref 135–145)

## 2015-09-07 LAB — CBC
HEMATOCRIT: 46.9 % (ref 40.0–52.0)
Hemoglobin: 15.9 g/dL (ref 13.0–18.0)
MCH: 29.5 pg (ref 26.0–34.0)
MCHC: 33.9 g/dL (ref 32.0–36.0)
MCV: 87.1 fL (ref 80.0–100.0)
Platelets: 219 10*3/uL (ref 150–440)
RBC: 5.38 MIL/uL (ref 4.40–5.90)
RDW: 13.2 % (ref 11.5–14.5)
WBC: 13.5 10*3/uL — ABNORMAL HIGH (ref 3.8–10.6)

## 2015-09-07 LAB — URINALYSIS COMPLETE WITH MICROSCOPIC (ARMC ONLY)
BACTERIA UA: NONE SEEN
Bilirubin Urine: NEGATIVE
GLUCOSE, UA: NEGATIVE mg/dL
LEUKOCYTES UA: NEGATIVE
Nitrite: NEGATIVE
PROTEIN: 100 mg/dL — AB
SQUAMOUS EPITHELIAL / LPF: NONE SEEN
Specific Gravity, Urine: 1.031 — ABNORMAL HIGH (ref 1.005–1.030)
pH: 5 (ref 5.0–8.0)

## 2015-09-07 MED ORDER — KETOROLAC TROMETHAMINE 30 MG/ML IJ SOLN
30.0000 mg | Freq: Once | INTRAMUSCULAR | Status: AC
  Administered 2015-09-07: 30 mg via INTRAVENOUS
  Filled 2015-09-07: qty 1

## 2015-09-07 MED ORDER — ONDANSETRON HCL 4 MG PO TABS: 4.0000 mg | ORAL_TABLET | Freq: Every day | ORAL | Status: AC | PRN

## 2015-09-07 MED ORDER — IBUPROFEN 800 MG PO TABS: 800.0000 mg | ORAL_TABLET | Freq: Three times a day (TID) | ORAL | Status: AC | PRN

## 2015-09-07 MED ORDER — TAMSULOSIN HCL 0.4 MG PO CAPS: 0.4000 mg | ORAL_CAPSULE | Freq: Every day | ORAL | Status: AC

## 2015-09-07 MED ORDER — OXYCODONE-ACETAMINOPHEN 5-325 MG PO TABS: 1.0000 | ORAL_TABLET | ORAL | Status: AC | PRN

## 2015-09-07 MED ORDER — ONDANSETRON HCL 4 MG/2ML IJ SOLN
4.0000 mg | Freq: Once | INTRAMUSCULAR | Status: AC
  Administered 2015-09-07: 4 mg via INTRAVENOUS
  Filled 2015-09-07: qty 2

## 2015-09-07 MED ORDER — ACETAMINOPHEN 500 MG PO TABS
1000.0000 mg | ORAL_TABLET | Freq: Once | ORAL | Status: AC
  Administered 2015-09-07: 1000 mg via ORAL
  Filled 2015-09-07: qty 2

## 2015-09-07 NOTE — ED Notes (Addendum)
Pt states back and abd pain since Wednesday, states 1 epsiode of vomiting yesterday and then again today, states he has been taking stool softner, bowel prep and enema with no success, pain increases with eating and drinking

## 2015-09-07 NOTE — H&P (Signed)
Jeff Wade is an 49 y.o. male.   Chief Complaint: Right flank pain HPI:  49 yr old with right sided back/flank pain starting on Thursday evening.  Patient stated that he felt as if it was gas pains and took some laxatives and enema to try to relieve pain. He describes it as a pressure that comes and ago in waves in the right back. He states that the pain migrates around to his right groin and into the scrotum.  He has tried some ibuprofen for pain but it did not help.  He states that he has not had much of an appetite and has had some nausea and vomited once.  He has not had a BM since Thursday but he is passing flatus.  He denies any fever, chills, malaise, cough, sputum, chest pain, sob, diarrhea, abdominal pain, dysuria or hematuria.   Past Medical History  Diagnosis Date  . Anxiety   . Allergic rhinitis   . Mass of testicle 09/2011    R, concern for cancer, pending surgery  . Generalized osteoarthrosis, involving multiple sites 11-09-11    joint pain more in knees  . 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 No past surgeries    . Orchiectomy  11/16/2011    Procedure: ORCHIECTOMY;  Surgeon: Fredricka Bonine, MD;  Location: [facility];  Service: Urology;  Laterality: Right;  Right Radical Inguinal Orchiectomy     Family History  Problem Relation Age of Onset  . Diabetes Mother   . Ovarian cancer Mother   . Cancer Mother     unknown type  . Healthy Father   . Coronary artery disease Neg Hx   . Hypertension Neg Hx    Social History:  reports that he quit smoking about 3 years ago. His smoking use included Cigarettes. He has a 30 pack-year smoking history. He has never used smokeless tobacco. He reports that he drinks about 0.6 oz of alcohol per week. He reports that he does not use illicit drugs.  Allergies: No Known Allergies   (Not in a hospital admission)  Results for orders placed or performed during the hospital  encounter of 09/07/15 (from the past 48 hour(s))  CBC     Status: Abnormal   Collection Time: 09/07/15  1:04 PM  Result Value Ref Range   WBC 13.5 (H) 3.8 - 10.6 K/uL   RBC 5.38 4.40 - 5.90 MIL/uL   Hemoglobin 15.9 13.0 - 18.0 g/dL   HCT 46.9 40.0 - 52.0 %   MCV 87.1 80.0 - 100.0 fL   MCH 29.5 26.0 - 34.0 pg   MCHC 33.9 32.0 - 36.0 g/dL   RDW 13.2 11.5 - 14.5 %   Platelets 219 150 - 440 K/uL  Basic metabolic panel     Status: Abnormal   Collection Time: 09/07/15  1:04 PM  Result Value Ref Range   Sodium 137 135 - 145 mmol/L   Potassium 3.4 (L) 3.5 - 5.1 mmol/L   Chloride 98 (L) 101 - 111 mmol/L   CO2 28 22 - 32 mmol/L   Glucose, Bld 107 (H) 65 - 99 mg/dL   BUN 17 6 - 20 mg/dL   Creatinine, Ser 1.63 (H) 0.61 - 1.24 mg/dL   Calcium 9.1 8.9 - 10.3 mg/dL   GFR calc non Af Amer 48 (L) >60 mL/min   GFR calc Af Amer 55 (L) >60 mL/min  Comment: (NOTE) The eGFR has been calculated using the CKD EPI equation. This calculation has not been validated in all clinical situations. eGFR's persistently <60 mL/min signify possible Chronic Kidney Disease.    Anion gap 11 5 - 15   Ct Renal Stone Study  09/07/2015   CLINICAL DATA:  Right flank pain for 3 days  EXAM: CT ABDOMEN AND PELVIS WITHOUT CONTRAST  TECHNIQUE: Multidetector CT imaging of the abdomen and pelvis was performed following the standard protocol without IV contrast.  COMPARISON:  11/25/2011  FINDINGS: Lung bases are unremarkable. Sagittal images of the spine shows mild degenerative changes lumbar spine.  No calcified gallstones are noted within gallbladder. Unenhanced liver, pancreas, spleen and adrenal glands are unremarkable. There is mild right hydronephrosis and right hydroureter. Mild right periureteral stranding. No nephrolithiasis. No proximal ureteral calculi are noted.  Axial image 76 there is 2.2 mm calcified calculus in right UVJ.  Axial image 62 there is mild thickening of tip of the appendix up to 10 mm. Tiny amount of  adjacent fluid is noted. Although findings may be due to satellite ureteral inflammation, early tip appendicitis cannot be excluded. Clinical correlation is necessary. No small or obstruction. No free abdominal air. Prostate gland and seminal vesicle are unremarkable. There is no inguinal adenopathy.  IMPRESSION: 1. Mild right hydronephrosis and right hydroureter. Right periureteral stranding. 2. There is 2.2 mm calcified calculus in right UVJ. 3. Axial image 62 there is mild thickening of tip of the appendix up to 10 mm. Tiny amount of adjacent fluid is noted. Although findings may be due to satellite ureteral inflammation, early tip appendicitis cannot be excluded. Clinical correlation is necessary. 4. No small bowel obstruction. These results were called by telephone at the time of interpretation on 09/07/2015 at 2:02 pm to Dr. Eula Listen , who verbally acknowledged these results.   Electronically Signed   By: Lahoma Crocker M.D.   On: 09/07/2015 14:02    Review of Systems  Constitutional: Negative for fever, chills, weight loss and malaise/fatigue.  HENT: Positive for congestion. Negative for ear pain, sore throat and tinnitus.   Respiratory: Negative for sputum production and shortness of breath.   Cardiovascular: Negative for chest pain, palpitations and leg swelling.  Gastrointestinal: Positive for nausea, vomiting and constipation. Negative for heartburn, abdominal pain, diarrhea, blood in stool and melena.  Genitourinary: Positive for flank pain. Negative for dysuria, urgency, frequency and hematuria.  Musculoskeletal: Positive for back pain. Negative for myalgias, joint pain and neck pain.  Skin: Negative for itching and rash.  Neurological: Negative for dizziness, focal weakness, loss of consciousness and weakness.  Endo/Heme/Allergies: Positive for environmental allergies. Does not bruise/bleed easily.  Psychiatric/Behavioral: The patient is not nervous/anxious and does not have  insomnia.   All other systems reviewed and are negative.   Blood pressure 147/92, pulse 79, temperature 98 F (36.7 C), temperature source Oral, resp. rate 18, height _0  (1.651 m), weight 180 lb (81.647 kg), SpO2 96 %. Physical Exam  Vitals reviewed. Constitutional: He is oriented to person, place, and time. He appears well-developed and well-nourished. No distress.  HENT:  Head: Normocephalic and atraumatic.  Nose: Nose normal.  Mouth/Throat: Oropharynx is clear and moist. No oropharyngeal exudate.  Eyes: Conjunctivae and EOM are normal. Pupils are equal, round, and reactive to light. Right eye exhibits no discharge. Left eye exhibits no discharge. No scleral icterus.  Neck: Normal range of motion. Neck supple. No tracheal deviation present. No thyromegaly present.  Cardiovascular: Normal  rate, regular rhythm, normal heart sounds and intact distal pulses.  Exam reveals no gallop and no friction rub.   No murmur heard. Respiratory: Effort normal and breath sounds normal. No respiratory distress. He has no wheezes. He has no rales.  GI: Soft. Bowel sounds are normal. He exhibits no distension and no mass. There is no tenderness. There is no rebound and no guarding.  2cm umbilical hernia soft and easy to reduce, non-tender; Minimal right sided CVA tenderness but feels it deeper than touched  Genitourinary: Penis normal.  Scrotum: left testicle present, right previous removed, well healed scar, no pain to palpation, no inguinal hernias  Musculoskeletal: Normal range of motion. He exhibits no edema or tenderness.  Lymphadenopathy:    He has no cervical adenopathy.  Neurological: He is alert and oriented to person, place, and time. No cranial nerve deficit.  Skin: Skin is warm and dry. No rash noted. No erythema. No pallor.  Psychiatric: He has a normal mood and affect. His behavior is normal. Judgment and thought content normal.     Assessment/Plan 49 yr old with Right Flank pain.  I  personal reviewed the images of the CT scan which shows some inflammation around the right kidney with dilation of the uteropelvic junction and right ureter with a small stone at the ureter-bladder junction, with inflammation and stranding down the right ureter; The appendix appears normal in caliber until the tip which is minimally enlarged and the stranding inflammation from the right ureter extends to just adjacent to the appendiceal tip.  I also reviewed the patient's laboratory finds and reviewed his past records from Right testicular cancer treatments. Given the patient's classic presentation for renal stones and the findings on CT scan, as well as the lack of classical abdominal pain or typical presentation of appendicitis, I feel that he has a right renal stone.  I instructed the patient and wife that if his symptoms were to change to abdominal pain in the RLQ or he began having fever and chills that he should be re-evaluated.  Patient and family were given the opportunity to ask questions and have them answered.    Catherine L Loflin 09/07/2015, 3:15 PM

## 2015-09-07 NOTE — Discharge Instructions (Signed)
Please drink plenty of liquids to stay well-hydrated. He may take Motrin for mild to moderate pain, and Percocet for severe pain.  Do not drive within 8 hours taking Percocet.   Please return to the emergency department if you develop severe abdominal pain, fever, inability to keep down fluids, or any other symptoms concerning to you.

## 2015-09-07 NOTE — ED Notes (Signed)
Patient has history of testicular CA. Had right testicle removed. Right flank pain. Has been using OTC stool softeners, laxatives without relief.

## 2015-09-07 NOTE — ED Provider Notes (Addendum)
[facility] Emergency Department Provider Note  ____________________________________________  Time seen: Approximately 12:53 PM  I have reviewed the triage vital signs and the nursing notes.   HISTORY  Chief Complaint Constipation    HPI Jeff Wade is a 49 y.o. male with a history of testicular cancer, status post orchiectomy remotely, presenting with right flank and right lower quadrant abdominal pain. Pain is a sharp, intermittent pain that comes and goes and varies between 3/10-10/10. Patient reports last normal bowel movement 5 days ago, has had "squirts" and decreased passing of flatus since then. He has tried Gas-X, laxatives, stool softeners, and enemas without improvement. He describes a right flank pain that radiates to the right lower quadrant and when it's at its worst, the pain that radiates into the testicle. He denies any urinary frequency, dysuria or hematuria. No fevers, chills, diarrhea. Patient had one episode of vomiting several days ago.   Past Medical History  Diagnosis Date  . Anxiety   . Allergic rhinitis   . Mass of testicle 09/2011    R, concern for cancer, pending surgery  . Generalized osteoarthrosis, involving multiple sites 11-09-11    joint pain more in knees  . 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 Acute sinusitis, unspecified 03/28/2012  . GEN OSTEOARTHROSIS INVOLVING MULTIPLE SITES 07/02/2009  . ALLERGIC RHINITIS 03/19/2009  . ANXIETY 02/21/2009    Past Surgical History  Procedure Laterality Date  . No past surgeries    . Orchiectomy  11/16/2011    Procedure: ORCHIECTOMY;  Surgeon: Fredricka Bonine, MD;  Location: [facility];  Service: Urology;  Laterality: Right;  Right Radical Inguinal Orchiectomy     Current Outpatient Rx  Name  Route  Sig  Dispense  Refill  . fexofenadine (ALLEGRA) 180 MG tablet   Oral   Take 180 mg by mouth daily as needed.  allergies         . ibuprofen (ADVIL,MOTRIN) 800 MG tablet   Oral   Take 1 tablet (800 mg total) by mouth every 8 (eight) hours as needed for moderate pain (with food).   20 tablet   0   . ondansetron (ZOFRAN) 4 MG tablet   Oral   Take 1 tablet (4 mg total) by mouth daily as needed for nausea or vomiting.   30 tablet   1   . oxyCODONE-acetaminophen (ROXICET) 5-325 MG per tablet   Oral   Take 1 tablet by mouth every 4 (four) hours as needed for severe pain.   15 tablet   0   . tamsulosin (FLOMAX) 0.4 MG CAPS capsule   Oral   Take 1 capsule (0.4 mg total) by mouth daily.   15 capsule   0     Allergies Review of patient's allergies indicates no known allergies.  Family History  Problem Relation Age of Onset  . Diabetes Mother   . Ovarian cancer Mother   . Cancer Mother     unknown type  . Healthy Father   . Coronary artery disease Neg Hx   . Hypertension Neg Hx     Social History Social History  Substance Use Topics  . Smoking status: Former Smoker -- 1.00 packs/day for 30 years    Types: Cigarettes    Quit date: 11/01/2011  . Smokeless tobacco: Never Used  . Alcohol Use: 0.6 oz/week    1 Cans of beer per  week     Comment: Very rare    Review of Systems Constitutional: No fever/chills Eyes: No visual changes. ENT: No sore throat. Cardiovascular: Denies chest pain, palpitations. Respiratory: Denies shortness of breath.  No cough. Gastrointestinal: Positive for abdominal pain, constipation, and decreased flatus. No diarrhea. Genitourinary: Negative for dysuria, frequency, or hematuria. Musculoskeletal: Right flank pain. Skin: Negative for rash. Neurological: Negative for headaches, focal weakness or numbness. 10-point ROS otherwise negative.  ____________________________________________   PHYSICAL EXAM:  VITAL SIGNS: ED Triage Vitals  Enc Vitals Group     BP 09/07/15 1235 170/98 mmHg     Pulse Rate 09/07/15 1235 91     Resp 09/07/15 1235 18      Temp 09/07/15 1235 98 F (36.7 C)     Temp Source 09/07/15 1235 Oral     SpO2 09/07/15 1235 99 %     Weight 09/07/15 1235 180 lb (81.647 kg)     Height 09/07/15 1235 5\' 5"  (1.651 m)     Head Cir --      Peak Flow --      Pain Score 09/07/15 1235 10     Pain Loc --      Pain Edu? --      Excl. in [place]? --     Constitutional: Alert and oriented. Comfortable appearing, standing up because unable to get comfortable when laying down.Marland Kitchen Answer question appropriately. Eyes: Conjunctivae are normal.  EOMI. Head: Atraumatic. Nose: No congestion/rhinnorhea. Mouth/Throat: Mucous membranes are moist.  Neck: No stridor.  Supple.   Cardiovascular: Normal rate, regular rhythm. No murmurs, rubs or gallops.  Respiratory: Normal respiratory effort.  No retractions. Lungs CTAB.  No wheezes, rales or ronchi. Gastrointestinal: Positive right CVA tenderness. Abdomen is soft, nondistended, and minimally tender in the right lower quadrant.  Genitourinary: Normal penis without lesions. Absence of the right testicle. Left testicle is without mass or tenderness. Left testicle has a normal lie. No inguinal hernias. Musculoskeletal: No LE edema.  Neurologic:  Normal speech and language. No gross focal neurologic deficits are appreciated.  Skin:  Skin is warm, dry and intact. No rash noted. Psychiatric: Mood and affect are normal. Speech and behavior are normal.  Normal judgement.  ____________________________________________   LABS (all labs ordered are listed, but only abnormal results are displayed)  Labs Reviewed  CBC - Abnormal; Notable for the following:    WBC 13.5 (*)    All other components within normal limits  BASIC METABOLIC PANEL - Abnormal; Notable for the following:    Potassium 3.4 (*)    Chloride 98 (*)    Glucose, Bld 107 (*)    Creatinine, Ser 1.63 (*)    GFR calc non Af Amer 48 (*)    GFR calc Af Amer 55 (*)    All other components within normal limits  URINALYSIS COMPLETEWITH  MICROSCOPIC ([facility] ONLY)   ____________________________________________  EKG   ____________________________________________  RADIOLOGY  Ct Renal Stone Study  09/07/2015   CLINICAL DATA:  Right flank pain for 3 days  EXAM: CT ABDOMEN AND PELVIS WITHOUT CONTRAST  TECHNIQUE: Multidetector CT imaging of the abdomen and pelvis was performed following the standard protocol without IV contrast.  COMPARISON:  11/25/2011  FINDINGS: Lung bases are unremarkable. Sagittal images of the spine shows mild degenerative changes lumbar spine.  No calcified gallstones are noted within gallbladder. Unenhanced liver, pancreas, spleen and adrenal glands are unremarkable. There is mild right hydronephrosis and right hydroureter. Mild right periureteral stranding. No nephrolithiasis.  No proximal ureteral calculi are noted.  Axial image 76 there is 2.2 mm calcified calculus in right UVJ.  Axial image 62 there is mild thickening of tip of the appendix up to 10 mm. Tiny amount of adjacent fluid is noted. Although findings may be due to satellite ureteral inflammation, early tip appendicitis cannot be excluded. Clinical correlation is necessary. No small or obstruction. No free abdominal air. Prostate gland and seminal vesicle are unremarkable. There is no inguinal adenopathy.  IMPRESSION: 1. Mild right hydronephrosis and right hydroureter. Right periureteral stranding. 2. There is 2.2 mm calcified calculus in right UVJ. 3. Axial image 62 there is mild thickening of tip of the appendix up to 10 mm. Tiny amount of adjacent fluid is noted. Although findings may be due to satellite ureteral inflammation, early tip appendicitis cannot be excluded. Clinical correlation is necessary. 4. No small bowel obstruction. These results were called by telephone at the time of interpretation on 09/07/2015 at 2:02 pm to Dr. Eula Listen , who verbally acknowledged these results.   Electronically Signed   By: Lahoma Crocker M.D.   On: 09/07/2015  14:02    ____________________________________________   PROCEDURES  Procedure(s) performed: None  Critical Care performed: No ____________________________________________   INITIAL IMPRESSION / ASSESSMENT AND PLAN / ED COURSE  Pertinent labs & imaging results that were available during my care of the patient were reviewed by me and considered in my medical decision making (see chart for details).  49 y.o. male with history of testicular cancer presenting with several days of right-sided flank pain, right lower quadrant pain. We'll evaluate for possible renal stone, consider obstruction, consider constipation and flatus.  ----------------------------------------- 2:04 PM on 09/07/2015 -----------------------------------------  CT scan does show 2.2 mm at the right UVJ, however, the appendix also appears slightly thickened compared to previous studies. One centimeter thickness, with surrounding fluid. At this time the differential includes renal colic versus appendicitis. I have called Dr. Heath Lark for consultation and she'll come see the patient. Labs are pending.  ----------------------------------------- 2:10 PM on 09/07/2015 -----------------------------------------  At this time, the patient states his pain has almost completely subsided. He states that prior to me coming in, he did have another "episode" where he had severe pain. He does describe a pain that is consistent with renal colic. However we will proceed with general surgery consultation, given his abnormal finding on CT.  ----------------------------------------- 3:08 PM on 09/07/2015 -----------------------------------------  The patient has been seen by Dr. Ronalee Belts, who feels that his presentation is most consistent with renal stone. I will plan to discharge the patient with urology follow-up. ____________________________________________  FINAL CLINICAL IMPRESSION(S) / ED DIAGNOSES  Final diagnoses:  Right  flank pain  Renal colic on right side      NEW MEDICATIONS STARTED DURING THIS VISIT:  New Prescriptions   IBUPROFEN (ADVIL,MOTRIN) 800 MG TABLET    Take 1 tablet (800 mg total) by mouth every 8 (eight) hours as needed for moderate pain (with food).   ONDANSETRON (ZOFRAN) 4 MG TABLET    Take 1 tablet (4 mg total) by mouth daily as needed for nausea or vomiting.   OXYCODONE-ACETAMINOPHEN (ROXICET) 5-325 MG PER TABLET    Take 1 tablet by mouth every 4 (four) hours as needed for severe pain.   TAMSULOSIN (FLOMAX) 0.4 MG CAPS CAPSULE    Take 1 capsule (0.4 mg total) by mouth daily.     Eula Listen, MD 09/07/15 1508  Eula Listen, MD 09/07/15 [phone]

## 2017-06-28 IMAGING — CT CT RENAL STONE PROTOCOL
1 of 2 series · 15 of 32 positions shown, 19 images · non-contrast
Comparison: 11/25/2011

CLINICAL DATA: Right flank pain for 3 days

EXAM:
CT ABDOMEN AND PELVIS WITHOUT CONTRAST
TECHNIQUE: Multidetector CT imaging of the abdomen and pelvis was performed
following the standard protocol without IV contrast.

[Series 2: stone standard full · axial · 0.74mm/px · z∈[-508,-48]mm · 15 of 100 slices shown, 19 images]
[im 4/100  soft-tissue]
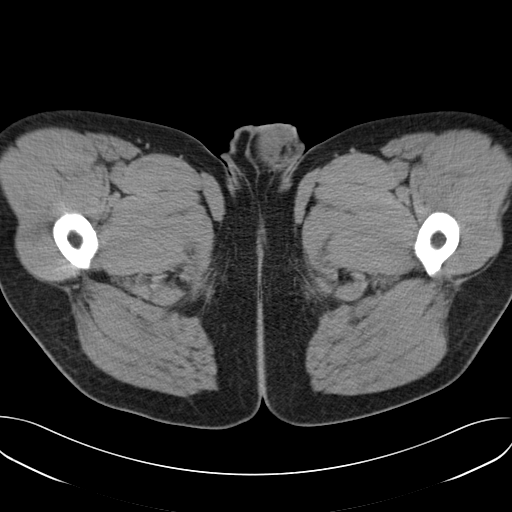
[im 4/100  bone]
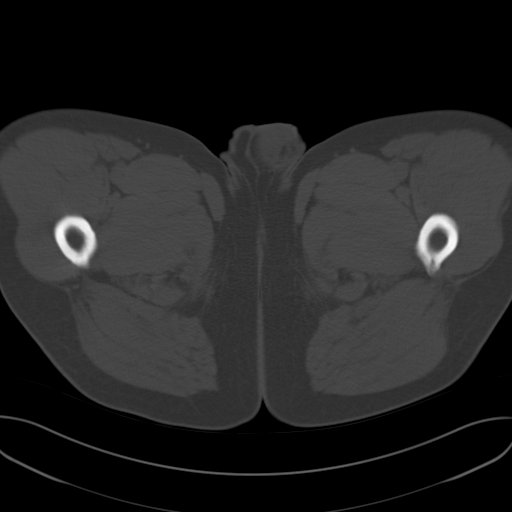
[im 12/100  soft-tissue]
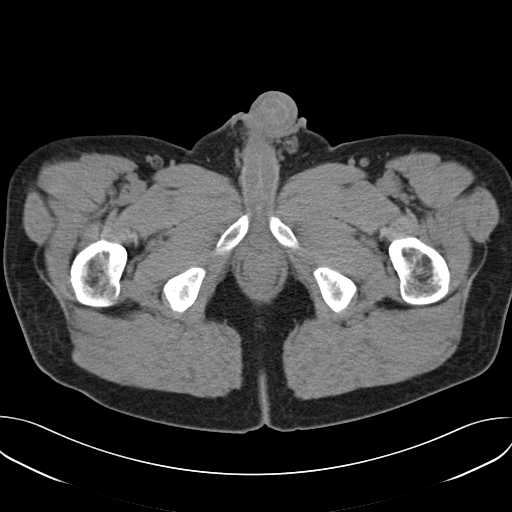
[im 20/100  soft-tissue]
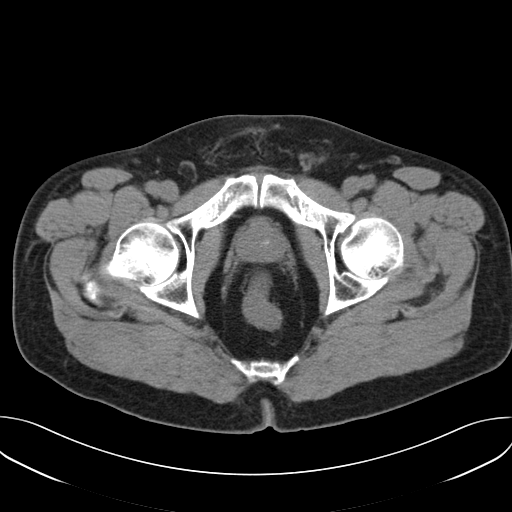
[im 28/100  soft-tissue]
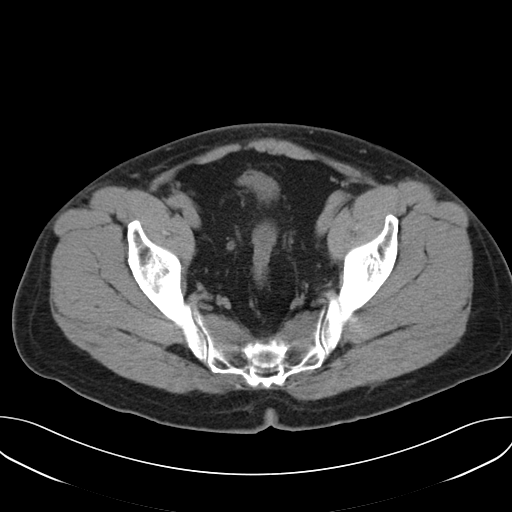
[im 36/100  soft-tissue]
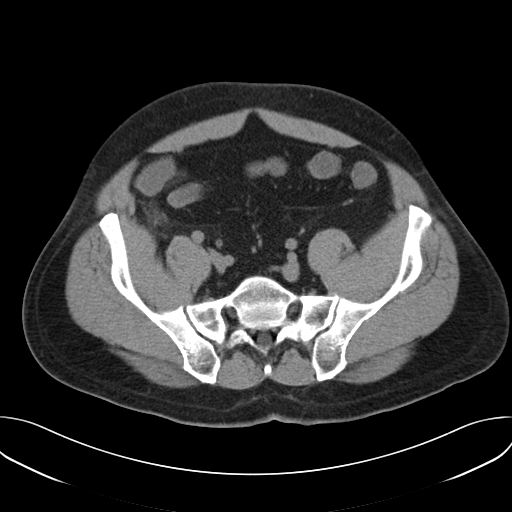
[im 44/100  soft-tissue]
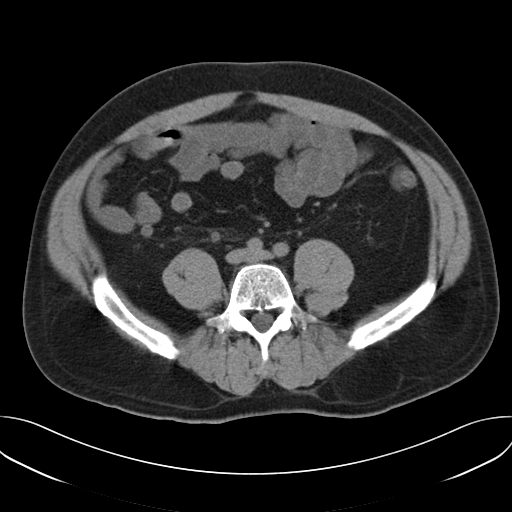
[im 52/100  soft-tissue]
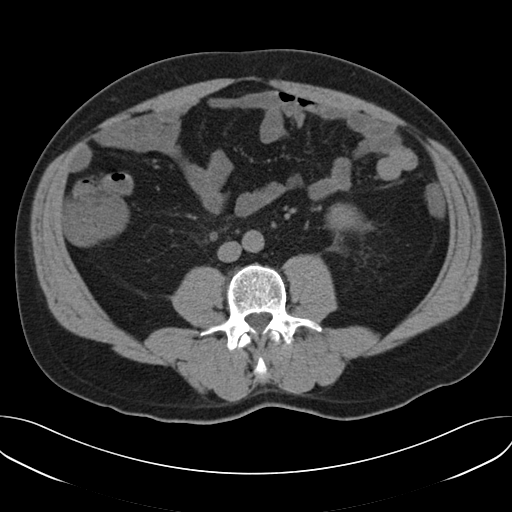
[im 56/100  soft-tissue]
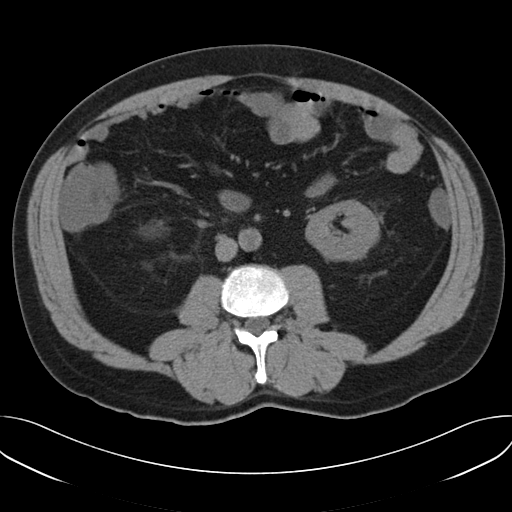
[im 64/100  soft-tissue]
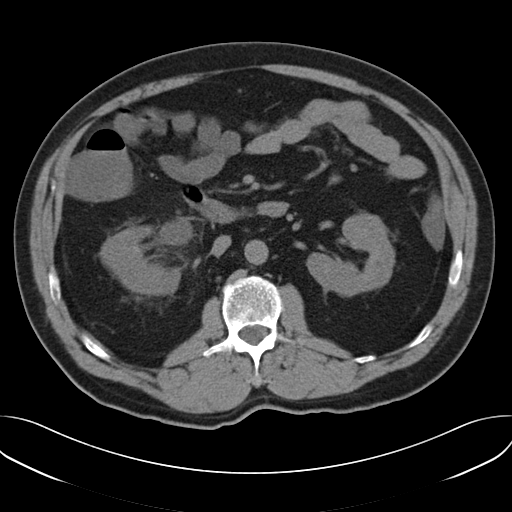
[im 64/100  bone]
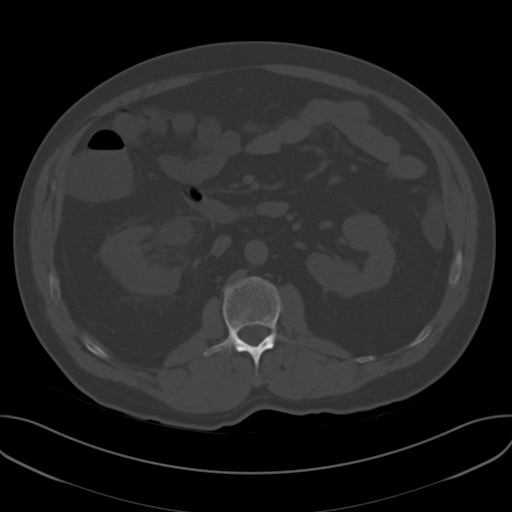
[im 72/100  soft-tissue]
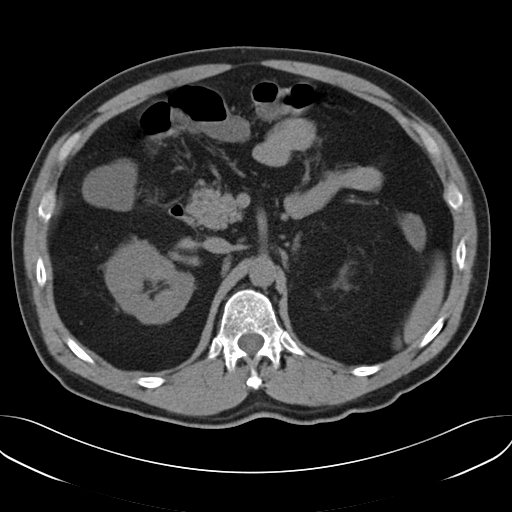
[im 80/100  soft-tissue]
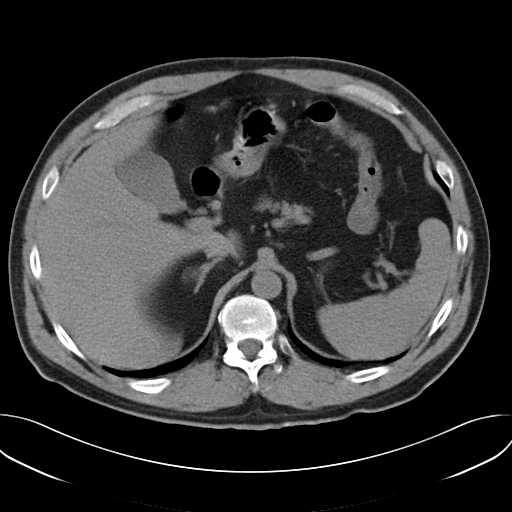
[im 84/100  lung]
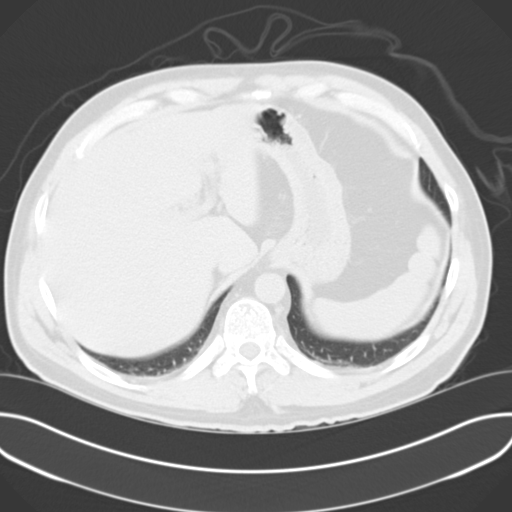
[im 88/100  soft-tissue]
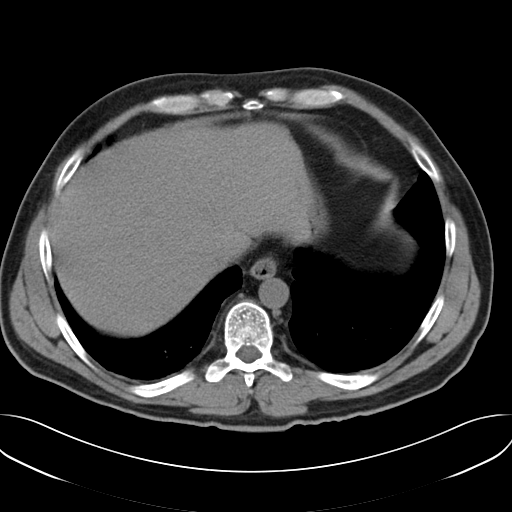
[im 88/100  lung]
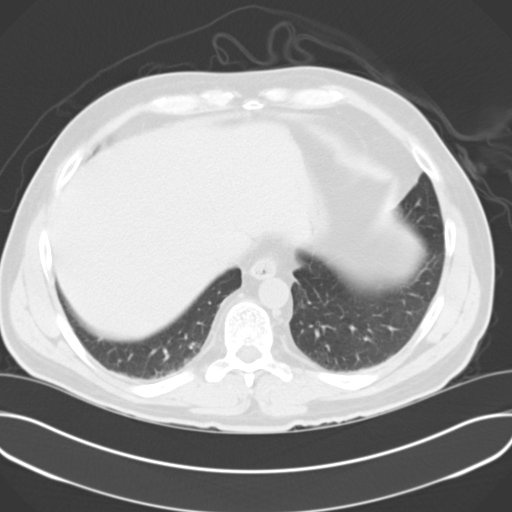
[im 92/100  lung]
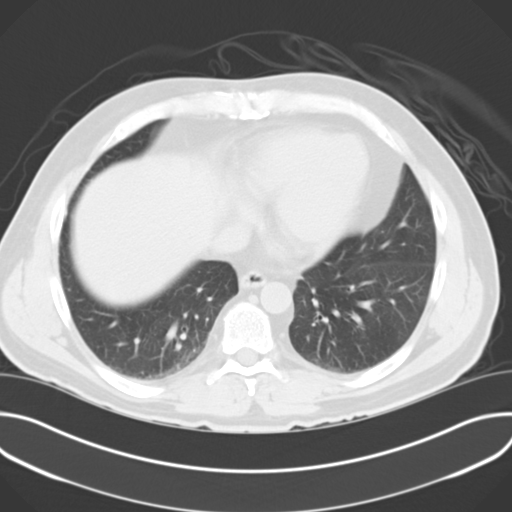
[im 96/100  soft-tissue]
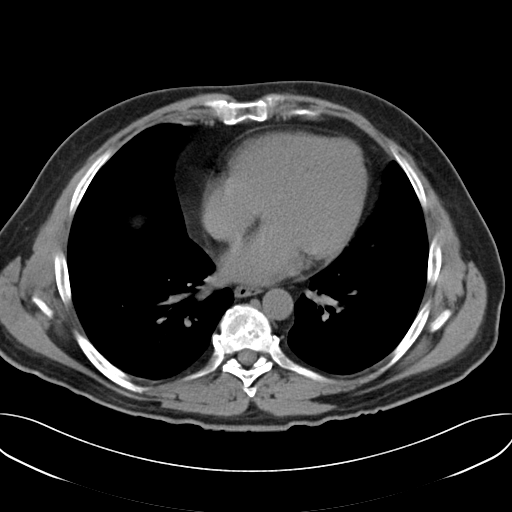
[im 96/100  lung]
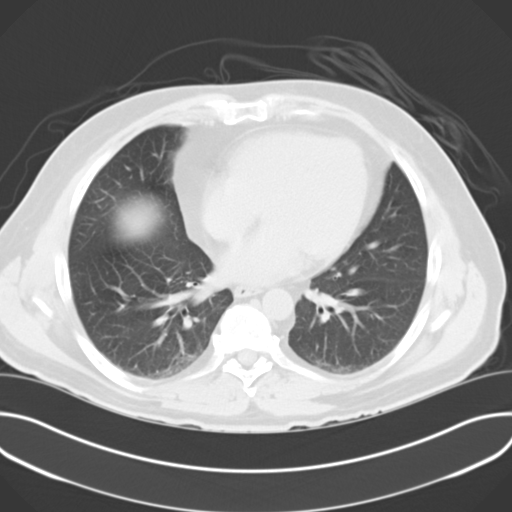

[15 of 32 positions shown; findings below may reference images not displayed]

FINDINGS: Lung bases are unremarkable. Sagittal images of the spine shows mild
degenerative changes lumbar spine.

No calcified gallstones are noted within gallbladder. Unenhanced
liver, pancreas, spleen and adrenal glands are unremarkable. There
is mild right hydronephrosis and right hydroureter. Mild right
periureteral stranding. No nephrolithiasis. No proximal ureteral
calculi are noted.

Axial image 76 there is 2.2 mm calcified calculus in right UVJ.

Axial image 62 there is mild thickening of tip of the appendix up to
10 mm. Tiny amount of adjacent fluid is noted. Although findings may
be due to satellite ureteral inflammation, early tip appendicitis
cannot be excluded. Clinical correlation is necessary. No small or
obstruction. No free abdominal air. Prostate gland and seminal
vesicle are unremarkable. There is no inguinal adenopathy.
IMPRESSION: 1. Mild right hydronephrosis and right hydroureter. Right
periureteral stranding.
2. There is 2.2 mm calcified calculus in right UVJ.
3. Axial image 62 there is mild thickening of tip of the appendix up
to 10 mm. Tiny amount of adjacent fluid is noted. Although findings
may be due to satellite ureteral inflammation, early tip
appendicitis cannot be excluded. Clinical correlation is necessary.
4. No small bowel obstruction.
These results were called by telephone at the time of interpretation
on 09/07/2015 at [DATE] to Dr. MABENA RAVELE , who verbally
acknowledged these results.
